# Patient Record
Sex: Female | Born: 1970 | Race: White | Hispanic: No | Marital: Single | Smoking: Never smoker
Health system: Southern US, Community
[De-identification: ages and names within clinical notes are randomized; demographics above are authoritative.]

## PROBLEM LIST (undated history)

## (undated) DIAGNOSIS — K219 Gastro-esophageal reflux disease without esophagitis: Secondary | ICD-10-CM

## (undated) DIAGNOSIS — R519 Headache, unspecified: Secondary | ICD-10-CM

## (undated) DIAGNOSIS — B369 Superficial mycosis, unspecified: Secondary | ICD-10-CM

## (undated) DIAGNOSIS — M1990 Unspecified osteoarthritis, unspecified site: Secondary | ICD-10-CM

## (undated) DIAGNOSIS — K209 Esophagitis, unspecified without bleeding: Secondary | ICD-10-CM

## (undated) DIAGNOSIS — R011 Cardiac murmur, unspecified: Secondary | ICD-10-CM

## (undated) DIAGNOSIS — G4730 Sleep apnea, unspecified: Secondary | ICD-10-CM

## (undated) DIAGNOSIS — D649 Anemia, unspecified: Secondary | ICD-10-CM

## (undated) DIAGNOSIS — E669 Obesity, unspecified: Secondary | ICD-10-CM

## (undated) DIAGNOSIS — I10 Essential (primary) hypertension: Secondary | ICD-10-CM

## (undated) HISTORY — DX: Superficial mycosis, unspecified: B36.9

## (undated) HISTORY — PX: HERNIA REPAIR: SHX51

## (undated) HISTORY — DX: Gastro-esophageal reflux disease without esophagitis: K21.9

## (undated) HISTORY — PX: TUBAL LIGATION: SHX77

## (undated) HISTORY — DX: Unspecified osteoarthritis, unspecified site: M19.90

## (undated) HISTORY — DX: Obesity, unspecified: E66.9

---

## 2000-08-20 ENCOUNTER — Other Ambulatory Visit: Admission: RE | Admit: 2000-08-20 | Discharge: 2000-08-20 | Payer: Self-pay | Admitting: Family Medicine

## 2009-02-03 ENCOUNTER — Other Ambulatory Visit: Admission: RE | Admit: 2009-02-03 | Discharge: 2009-02-03 | Payer: Self-pay | Admitting: Obstetrics and Gynecology

## 2010-12-10 ENCOUNTER — Encounter: Payer: Self-pay | Admitting: Obstetrics & Gynecology

## 2012-09-23 ENCOUNTER — Other Ambulatory Visit: Payer: Self-pay | Admitting: Obstetrics and Gynecology

## 2012-09-23 DIAGNOSIS — Z139 Encounter for screening, unspecified: Secondary | ICD-10-CM

## 2012-12-02 ENCOUNTER — Other Ambulatory Visit (HOSPITAL_COMMUNITY)
Admission: RE | Admit: 2012-12-02 | Discharge: 2012-12-02 | Disposition: A | Discharge status: Home / Self Care | Payer: BC Managed Care – PPO | Source: Ambulatory Visit | Attending: Obstetrics and Gynecology | Admitting: Obstetrics and Gynecology

## 2012-12-02 ENCOUNTER — Ambulatory Visit (HOSPITAL_COMMUNITY)
Admission: RE | Admit: 2012-12-02 | Discharge: 2012-12-02 | Disposition: A | Discharge status: Home / Self Care | Payer: BC Managed Care – PPO | Source: Ambulatory Visit | Attending: Obstetrics and Gynecology | Admitting: Obstetrics and Gynecology

## 2012-12-02 ENCOUNTER — Other Ambulatory Visit: Payer: Self-pay | Admitting: Adult Health

## 2012-12-02 DIAGNOSIS — Z1151 Encounter for screening for human papillomavirus (HPV): Secondary | ICD-10-CM | POA: Insufficient documentation

## 2012-12-02 DIAGNOSIS — Z01419 Encounter for gynecological examination (general) (routine) without abnormal findings: Secondary | ICD-10-CM | POA: Insufficient documentation

## 2012-12-02 DIAGNOSIS — Z139 Encounter for screening, unspecified: Secondary | ICD-10-CM

## 2012-12-02 DIAGNOSIS — Z1231 Encounter for screening mammogram for malignant neoplasm of breast: Secondary | ICD-10-CM | POA: Insufficient documentation

## 2012-12-05 ENCOUNTER — Other Ambulatory Visit: Payer: Self-pay | Admitting: Obstetrics and Gynecology

## 2012-12-05 DIAGNOSIS — R928 Other abnormal and inconclusive findings on diagnostic imaging of breast: Secondary | ICD-10-CM

## 2012-12-06 ENCOUNTER — Encounter: Payer: Self-pay | Admitting: Obstetrics and Gynecology

## 2012-12-24 ENCOUNTER — Other Ambulatory Visit: Payer: Self-pay | Admitting: Obstetrics and Gynecology

## 2012-12-24 ENCOUNTER — Ambulatory Visit (HOSPITAL_COMMUNITY)
Admission: RE | Admit: 2012-12-24 | Discharge: 2012-12-24 | Disposition: A | Discharge status: Home / Self Care | Payer: BC Managed Care – PPO | Source: Ambulatory Visit | Attending: Family Medicine | Admitting: Family Medicine

## 2012-12-24 ENCOUNTER — Ambulatory Visit (HOSPITAL_COMMUNITY)
Admission: RE | Admit: 2012-12-24 | Discharge: 2012-12-24 | Disposition: A | Discharge status: Home / Self Care | Payer: BC Managed Care – PPO | Source: Ambulatory Visit | Attending: Obstetrics and Gynecology | Admitting: Obstetrics and Gynecology

## 2012-12-24 ENCOUNTER — Other Ambulatory Visit: Payer: Self-pay | Admitting: Family Medicine

## 2012-12-24 ENCOUNTER — Ambulatory Visit
Admission: RE | Admit: 2012-12-24 | Discharge: 2012-12-24 | Disposition: A | Discharge status: Home / Self Care | Payer: BC Managed Care – PPO | Source: Ambulatory Visit | Attending: Obstetrics and Gynecology | Admitting: Obstetrics and Gynecology

## 2012-12-24 ENCOUNTER — Other Ambulatory Visit (HOSPITAL_COMMUNITY): Payer: Self-pay | Admitting: Obstetrics and Gynecology

## 2012-12-24 DIAGNOSIS — N63 Unspecified lump in unspecified breast: Secondary | ICD-10-CM

## 2012-12-24 DIAGNOSIS — R928 Other abnormal and inconclusive findings on diagnostic imaging of breast: Secondary | ICD-10-CM

## 2012-12-24 DIAGNOSIS — N6320 Unspecified lump in the left breast, unspecified quadrant: Secondary | ICD-10-CM

## 2012-12-24 MED ORDER — LIDOCAINE HCL (PF) 2 % IJ SOLN
INTRAMUSCULAR | Status: AC
  Administered 2012-12-24: 9 mL via INTRADERMAL
  Filled 2012-12-24: qty 10

## 2012-12-24 NOTE — Progress Notes (Signed)
Lidocaine 2%             9mL injected                         Left breast biopsy performed 

## 2013-02-20 ENCOUNTER — Encounter: Payer: Self-pay | Admitting: Family Medicine

## 2013-02-20 ENCOUNTER — Other Ambulatory Visit: Payer: BC Managed Care – PPO

## 2013-02-20 ENCOUNTER — Ambulatory Visit (INDEPENDENT_AMBULATORY_CARE_PROVIDER_SITE_OTHER): Payer: BC Managed Care – PPO | Admitting: Family Medicine

## 2013-02-20 VITALS — Ht 64.0 in | Wt 224.6 lb

## 2013-02-20 DIAGNOSIS — R7989 Other specified abnormal findings of blood chemistry: Secondary | ICD-10-CM

## 2013-02-20 DIAGNOSIS — IMO0001 Reserved for inherently not codable concepts without codable children: Secondary | ICD-10-CM

## 2013-02-20 DIAGNOSIS — E669 Obesity, unspecified: Secondary | ICD-10-CM

## 2013-02-20 DIAGNOSIS — M791 Myalgia, unspecified site: Secondary | ICD-10-CM | POA: Insufficient documentation

## 2013-02-20 DIAGNOSIS — K219 Gastro-esophageal reflux disease without esophagitis: Secondary | ICD-10-CM | POA: Insufficient documentation

## 2013-02-20 LAB — POCT CBC
Granulocyte percent: 78.6 %G (ref 37–80)
HCT, POC: 38.2 % (ref 37.7–47.9)
Hemoglobin: 12.9 g/dL (ref 12.2–16.2)
Lymph, poc: 1.8 (ref 0.6–3.4)
MCH, POC: 28.4 pg (ref 27–31.2)
MCHC: 33.8 g/dL (ref 31.8–35.4)
MCV: 83.8 fL (ref 80–97)
MPV: 7.9 fL (ref 0–99.8)
POC Granulocyte: 8.1 — AB (ref 2–6.9)
POC LYMPH PERCENT: 17.9 %L (ref 10–50)
Platelet Count, POC: 404 10*3/uL (ref 142–424)
RBC: 4.6 M/uL (ref 4.04–5.48)
RDW, POC: 12.8 %
WBC: 10.3 10*3/uL — AB (ref 4.6–10.2)

## 2013-02-20 LAB — SEDIMENTATION RATE: Sed Rate: 24 mm/hr — ABNORMAL HIGH (ref 0–22)

## 2013-02-20 LAB — C-REACTIVE PROTEIN: CRP: 1.4 mg/dL — ABNORMAL HIGH (ref <0.60)

## 2013-02-20 NOTE — Assessment & Plan Note (Signed)
meds refilled. Stable.

## 2013-02-20 NOTE — Patient Instructions (Addendum)
Diet and Exercise discussed with patient. For nutrition information, I recommend books: Eat to Live by Dr Joel Fuhrman. Prevent and Reverse Heart Disease by Dr Caldwell Esselstyn.  Exercise recommendations are:  If unable to walk, then the patient can exercise in a chair 3 times a day. By flapping arms like a bird gently and raising legs outwards to the front.  If ambulatory, the patient can go for walks for 30 minutes 3 times a week. Then increase the intensity and duration as tolerated. Goal is to try to attain exercise frequency to 5 times a week. Best to perform resistance exercises 2 days a week and cardio type exercises 3 days per week.  

## 2013-02-20 NOTE — Assessment & Plan Note (Signed)
supect that patient health is affected significantly by this problem

## 2013-02-20 NOTE — Assessment & Plan Note (Signed)
Probably related to obesity and  Sleep deprivation. Patient works 2 jobs, eats [employer] and has a obesity problem.

## 2013-02-20 NOTE — Progress Notes (Signed)
Patient ID: Deanna Long, female   DOB: 02/04/1971, 42 y.o.   MRN: [id number] SUBJECTIVE:   HPI: Here for follow up off abnormal blood work. She had her gynecologis examination. She was having myalgias. Labs were done. Results reflective of mild inflammation. Had labs redrawn. On review, her CRP was mildly elevated. Her platelets was mildly elevated and her sedimentation rate was high normal.   PMH/PSH: reviewed/updated in Epic  SH/FH: reviewed/updated in Epic  Allergies: reviewed/updated in Epic  Medications: reviewed/updated in Epic  Immunizations: reviewed/updated in Epic    ROS: No other complaints  OBJECTIVE:    On examination she appeared in good health and spirits. Very obese.  Vital signs as documented. Ht 5\' 4"  (1.626 m)  Wt 224 lb 9.6 oz (101.878 kg)  BMI 38.53 kg/m2  LMP 01/20/2013  Skin warm and dry and without overt rashes.  Head, Eyes, Ears, throat: normal Neck without JVD.  Lungs clear.  Heart exam notable for regular rhythm, normal sounds and absence of murmurs, rubs or gallops. Abdomen unremarkable and without evidence of organomegaly, masses, or abdominal aortic enlargement.  GYN Exam:deferred External Genitalia:N/A Vagina: Cervix: Uterus: Adnexa: R/V: Extremities nonedematous. No trigger points for Fibromyalgias Neurologic: nonfocal  ASSESSMENT:  Obesity, unspecified supect that patient health is affected significantly by this problem  GERD (gastroesophageal reflux disease) meds refilled. Stable.  Muscle ache Probably related to obesity and  Sleep deprivation. Patient works 2 jobs, eats [employer] and has a obesity problem.     PLAN: Orders Placed This Encounter  Procedures  . Sedimentation rate  . C-reactive protein  . POCT CBC   Results for orders placed in visit on 02/20/13 (from the past 24 hour(s))  POCT CBC     Status: Abnormal   Collection Time    02/20/13 10:40 AM      Result Value Range   WBC 10.3 (*) 4.6 - 10.2 K/uL   Lymph, poc 1.8  0.6 - 3.4   POC LYMPH PERCENT 17.9  10 - 50 %L   POC Granulocyte 8.1 (*) 2 - 6.9   Granulocyte percent 78.6  37 - 80 %G   RBC 4.6  4.04 - 5.48 M/uL   Hemoglobin 12.9  12.2 - 16.2 g/dL   HCT, POC 82.9  56.2 - 47.9 %   MCV 83.8  80 - 97 fL   MCH, POC 28.4  27 - 31.2 pg   MCHC 33.8  31.8 - 35.4 g/dL   RDW, POC 13.0     Platelet Count, POC 404.0  142 - 424 K/uL   MPV 7.9  0 - 99.8 fL                               Meds ordered this encounter  Medications  . ibuprofen (ADVIL,MOTRIN) 800 MG tablet    Sig: Take 800 mg by mouth every 8 (eight) hours as needed for pain.  Marland Kitchen esomeprazole (NEXIUM) 40 MG capsule    Sig: Take 40 mg by mouth daily before breakfast.  Diet and Exercise discussed with patient. For nutrition information, I recommend books: Eat to Live by Dr Monico Hoar. Prevent and Reverse Heart Disease by Dr Suzzette Righter.  Exercise recommendations are:  If unable to walk, then the patient can exercise in a chair 3 times a day. By flapping arms like a bird gently and raising legs outwards to the front.  If ambulatory, the patient can go  for walks for 30 minutes 3 times a week. Then increase the intensity and duration as tolerated. Goal is to try to attain exercise frequency to 5 times a week. Best to perform resistance exercises 2 days a week and cardio type exercises 3 days per week. Balanced Lifestyle. Await labs.  Kadrian Partch P. Modesto Charon, M.D.

## 2013-02-22 NOTE — Progress Notes (Signed)
Quick Note:  Labs abnormal.Still borderline abnormal. Recommend diet weight loss and consider Rheumatologist for consultation? If patient wants. ______

## 2013-02-24 ENCOUNTER — Other Ambulatory Visit: Payer: Self-pay | Admitting: Family Medicine

## 2013-02-24 DIAGNOSIS — M25559 Pain in unspecified hip: Secondary | ICD-10-CM

## 2013-08-19 ENCOUNTER — Ambulatory Visit (INDEPENDENT_AMBULATORY_CARE_PROVIDER_SITE_OTHER): Payer: BC Managed Care – PPO | Admitting: Family Medicine

## 2013-08-19 VITALS — BP 146/90 | HR 70 | Temp 97.0°F | Ht 62.0 in | Wt 220.0 lb

## 2013-08-19 DIAGNOSIS — R634 Abnormal weight loss: Secondary | ICD-10-CM

## 2013-08-19 MED ORDER — PHENTERMINE HCL 30 MG PO CAPS: 30.0000 mg | ORAL_CAPSULE | ORAL | Status: DC

## 2013-08-19 NOTE — Progress Notes (Signed)
  Subjective:    Patient ID: Deanna Long, female    DOB: 08-05-1971, 43 y.o.   MRN: [id number]  HPI This 42 y.o. female presents for evaluation of needing to get into a weight loss program For her work. She is being offered up to 60% off her insurance plan if she gets into a  Weight loss program.  She has a form for work she needs filled out.   Review of Systems No chest pain, SOB, HA, dizziness, vision change, N/V, diarrhea, constipation, dysuria, urinary urgency or frequency, myalgias, arthralgias or rash.     Objective:   Physical Exam Vital signs noted  Well developed well nourished female.  HEENT - Head atraumatic Normocephalic                Eyes - PERRLA, Conjuctiva - clear Sclera- Clear EOMI                Ears - EAC's Wnl TM's Wnl Gross Hearing WNL                Nose - Nares patent                 Throat - oropharanx wnl Respiratory - Lungs CTA bilateral Cardiac - RRR S1 and S2 without murmur GI - Abdomen soft Nontender and bowel sounds active x 4.      Assessment & Plan:  Loss of weight - Plan: phentermine 30 MG capsule Recommend she follow up with Deanna Long Pharm D Follow up prn. Form from work filled out.  Deanna Canter FNP

## 2013-08-24 ENCOUNTER — Other Ambulatory Visit: Payer: Self-pay

## 2013-08-24 NOTE — Telephone Encounter (Signed)
Last seen 02/20/13  FPW

## 2013-08-24 NOTE — Telephone Encounter (Signed)
Patient needs to be seen. Has exceeded time since last visit. Needs to bring all medications to next appointment. She was referred to the rheumatologist and supposedly treated.

## 2013-09-24 ENCOUNTER — Other Ambulatory Visit: Payer: Self-pay

## 2014-01-19 ENCOUNTER — Other Ambulatory Visit: Payer: Self-pay | Admitting: Obstetrics and Gynecology

## 2014-01-19 DIAGNOSIS — Z1231 Encounter for screening mammogram for malignant neoplasm of breast: Secondary | ICD-10-CM

## 2014-02-04 ENCOUNTER — Ambulatory Visit (HOSPITAL_COMMUNITY): Payer: BC Managed Care – PPO

## 2014-02-04 ENCOUNTER — Ambulatory Visit: Payer: Managed Care, Other (non HMO) | Admitting: Family Medicine

## 2014-02-08 ENCOUNTER — Telehealth: Payer: Self-pay | Admitting: Family Medicine

## 2014-02-08 NOTE — Telephone Encounter (Signed)
Appt given for patient request

## 2014-02-11 ENCOUNTER — Ambulatory Visit (INDEPENDENT_AMBULATORY_CARE_PROVIDER_SITE_OTHER): Payer: Managed Care, Other (non HMO) | Admitting: Adult Health

## 2014-02-11 ENCOUNTER — Ambulatory Visit (HOSPITAL_COMMUNITY)
Admission: RE | Admit: 2014-02-11 | Discharge: 2014-02-11 | Disposition: A | Discharge status: Home / Self Care | Payer: Managed Care, Other (non HMO) | Source: Ambulatory Visit | Attending: Obstetrics and Gynecology | Admitting: Obstetrics and Gynecology

## 2014-02-11 ENCOUNTER — Ambulatory Visit (INDEPENDENT_AMBULATORY_CARE_PROVIDER_SITE_OTHER): Payer: Managed Care, Other (non HMO) | Admitting: Family Medicine

## 2014-02-11 ENCOUNTER — Encounter: Payer: Self-pay | Admitting: Family Medicine

## 2014-02-11 ENCOUNTER — Encounter: Payer: Self-pay | Admitting: Adult Health

## 2014-02-11 ENCOUNTER — Ambulatory Visit: Payer: BC Managed Care – PPO | Admitting: Family Medicine

## 2014-02-11 ENCOUNTER — Telehealth: Payer: Self-pay | Admitting: Family Medicine

## 2014-02-11 VITALS — BP 125/80 | HR 72 | Temp 98.3°F | Ht 62.0 in | Wt 215.2 lb

## 2014-02-11 VITALS — BP 118/70 | HR 74 | Ht 62.0 in | Wt 216.0 lb

## 2014-02-11 DIAGNOSIS — Z1231 Encounter for screening mammogram for malignant neoplasm of breast: Secondary | ICD-10-CM

## 2014-02-11 DIAGNOSIS — M549 Dorsalgia, unspecified: Secondary | ICD-10-CM

## 2014-02-11 DIAGNOSIS — Z1212 Encounter for screening for malignant neoplasm of rectum: Secondary | ICD-10-CM

## 2014-02-11 DIAGNOSIS — Z01419 Encounter for gynecological examination (general) (routine) without abnormal findings: Secondary | ICD-10-CM

## 2014-02-11 LAB — HEMOCCULT GUIAC POC 1CARD (OFFICE): Fecal Occult Blood, POC: NEGATIVE

## 2014-02-11 MED ORDER — METHYLPREDNISOLONE (PAK) 4 MG PO TABS: ORAL_TABLET | ORAL | Status: DC

## 2014-02-11 NOTE — Progress Notes (Signed)
   Subjective:    Patient ID: Deanna HaywardGina Long, female    DOB: 09/11/1971, 43 y.o.   MRN: [id number]  HPI This 43 y.o. female presents for evaluation of back pain and pain in her legs and her feet. She has back pain for a week and she notices it gets worse over last few days.  Review of Systems C/o back pain   No chest pain, SOB, HA, dizziness, vision change, N/V, diarrhea, constipation, dysuria, urinary urgency or frequency or rash.  Objective:   Physical Exam Vital signs noted  Well developed well nourished female.  HEENT - Head atraumatic Normocephalic                Eyes - PERRLA, Conjuctiva - clear Sclera- Clear EOMI                Ears - EAC's Wnl TM's Wnl Gross Hearing WNL                Nose - Nares patent                 Throat - oropharanx wnl Respiratory - Lungs CTA bilateral Cardiac - RRR S1 and S2 without murmur GI - Abdomen soft Nontender and bowel sounds active x 4 MS - TTP lumbar spine and decreased ROM LS spine      Assessment & Plan:  Back pain - Plan: DG Lumbar Spine 2-3 Views, methylPREDNIsolone (MEDROL DOSPACK) 4 MG tablet Continue motrin 800mg  one po tid   Prilosec otc prn for GERD  Follow up in one week or if sxs worsen  Deatra CanterWilliam J Lilit Cinelli FNP

## 2014-02-11 NOTE — Patient Instructions (Signed)

## 2014-02-11 NOTE — Telephone Encounter (Signed)
appt rsch

## 2014-02-11 NOTE — Patient Instructions (Signed)
Physical in 1 year Mammogram yearly  

## 2014-02-11 NOTE — Progress Notes (Signed)
Patient ID: Deanna Long, female   DOB: 08/28/1971, 43 y.o.   MRN: [id number] History of Present Illness: Deanna Long is a 43 year old white female in for physical.She has a normal pap with negative HPV 12/02/12.   Current Medications, Allergies, Past Medical History, Past Surgical History, Family History and Social History were reviewed in [facility] electronic medical record.     Review of Systems: Patient denies any headaches, blurred vision, shortness of breath, chest pain, abdominal pain, problems with bowel movements, urination, or intercourse.  No joint pain or mood swings, had mammogram today, and F/U with PCP this pm.   Physical Exam:BP 118/70  Pulse 74  Ht 5\' 2"  (1.575 m)  Wt 216 lb (97.977 kg)  BMI 39.50 kg/m2  LMP 01/28/2014 General:  Well developed, well nourished, no acute distress Skin:  Warm and dry Neck:  Midline trachea, normal thyroid Lungs; Clear to auscultation bilaterally Breast:  No dominant palpable mass, retraction, or nipple discharge Cardiovascular: Regular rate and rhythm Abdomen:  Soft, non tender, no hepatosplenomegaly Pelvic:  External genitalia is normal in appearance.  The vagina is normal in appearance. The cervix is bulbous.  Uterus is felt to be normal size, shape, and contour.  No   adnexal masses or tenderness noted. Rectal: Good sphincter tone, no polyps, or hemorrhoids felt.  Hemoccult negative. Extremities:  No swelling or varicosities noted Psych:  No mood changes, alert and cooperative, seems happy   Impression: Yearly gyn exam no pap    Plan: Physical in 1 year Mammogram yearly Call prn

## 2014-02-12 ENCOUNTER — Other Ambulatory Visit (INDEPENDENT_AMBULATORY_CARE_PROVIDER_SITE_OTHER): Payer: Managed Care, Other (non HMO)

## 2014-02-12 DIAGNOSIS — M549 Dorsalgia, unspecified: Secondary | ICD-10-CM

## 2014-02-18 ENCOUNTER — Encounter: Payer: Self-pay | Admitting: Family Medicine

## 2014-02-18 ENCOUNTER — Ambulatory Visit (INDEPENDENT_AMBULATORY_CARE_PROVIDER_SITE_OTHER): Payer: Managed Care, Other (non HMO) | Admitting: Family Medicine

## 2014-02-18 ENCOUNTER — Other Ambulatory Visit: Payer: Self-pay | Admitting: *Deleted

## 2014-02-18 VITALS — BP 127/79 | HR 64 | Temp 97.5°F | Ht 62.0 in | Wt 219.2 lb

## 2014-02-18 DIAGNOSIS — IMO0002 Reserved for concepts with insufficient information to code with codable children: Secondary | ICD-10-CM

## 2014-02-18 DIAGNOSIS — M5416 Radiculopathy, lumbar region: Secondary | ICD-10-CM

## 2014-02-18 MED ORDER — ALPRAZOLAM 0.25 MG PO TABS: 0.2500 mg | ORAL_TABLET | Freq: Every evening | ORAL | Status: DC | PRN

## 2014-02-18 MED ORDER — PREDNISONE 10 MG PO TABS: ORAL_TABLET | ORAL | Status: DC

## 2014-02-18 MED ORDER — IBUPROFEN 800 MG PO TABS: 800.0000 mg | ORAL_TABLET | Freq: Three times a day (TID) | ORAL | Status: DC | PRN

## 2014-02-22 NOTE — Progress Notes (Signed)
   Subjective:    Patient ID: Deanna Long, female    DOB: 10/07/1971, 43 y.o.   MRN: [id number]  HPI  This 43 y.o. female presents for evaluation of continued back and right leg numbness and weakness. She took a medrol dose pack and this didn't help.  She had a normal Xray of her LS spine except for some bone spurring.  She states her pain radiates down her back to her foot and she is having weakness in this leg.  Review of Systems C/o right lower extremity pain.   No chest pain, SOB, HA, dizziness, vision change, N/V, diarrhea, constipation, dysuria, urinary urgency or frequency, myalgias, arthralgias or rash.  Objective:   Physical Exam  Vital signs noted  Well developed well nourished female.  HEENT - Head atraumatic Normocephalic                Eyes - PERRLA, Conjuctiva - clear Sclera- Clear EOMI                Ears - EAC's Wnl TM's Wnl Gross Hearing WNL                Throat - oropharanx wnl Respiratory - Lungs CTA bilateral Cardiac - RRR S1 and S2 without murmur GI - Abdomen soft Nontender and bowel sounds active x 4 Extremities - weakness in right lower extremity. Neuro - Weakness in right foot and lower extremity MS - TTP right LS muscle and positive right SLR.     Assessment & Plan:  Lumbar back pain with radiculopathy affecting right lower extremity - Plan: MR Lumbar Spine Wo Contrast Prednisone taper 10mg  4x2,3x2,2x2,1x2 then stop  Deanna CanterWilliam J Oxford FNP Deanna CanterWilliam J Oxford FNP

## 2014-03-09 ENCOUNTER — Telehealth: Payer: Self-pay | Admitting: Family Medicine

## 2014-03-10 NOTE — Telephone Encounter (Signed)
appt given for thurs 

## 2014-03-11 ENCOUNTER — Ambulatory Visit (INDEPENDENT_AMBULATORY_CARE_PROVIDER_SITE_OTHER): Payer: Managed Care, Other (non HMO) | Admitting: Family Medicine

## 2014-03-11 ENCOUNTER — Encounter: Payer: Self-pay | Admitting: Family Medicine

## 2014-03-11 VITALS — BP 165/91 | HR 80 | Temp 96.5°F | Ht 62.0 in | Wt 217.0 lb

## 2014-03-11 DIAGNOSIS — M549 Dorsalgia, unspecified: Secondary | ICD-10-CM

## 2014-03-11 DIAGNOSIS — G4700 Insomnia, unspecified: Secondary | ICD-10-CM

## 2014-03-11 MED ORDER — HYDROCODONE-ACETAMINOPHEN 5-325 MG PO TABS: 1.0000 | ORAL_TABLET | Freq: Four times a day (QID) | ORAL | Status: DC | PRN

## 2014-03-11 MED ORDER — ALPRAZOLAM 0.5 MG PO TABS: 0.5000 mg | ORAL_TABLET | Freq: Every evening | ORAL | Status: DC | PRN

## 2014-03-11 NOTE — Progress Notes (Signed)
   Subjective:    Patient ID: Deanna HaywardGina Long, female    DOB: 04/28/1971, 43 y.o.   MRN: [id number]  HPI This 43 y.o. female presents for evaluation of back pain and some radiation down her right leg and it is better but she is still hurting.   She is having insomnia and has been taking 2 xanax 0.25mg  and she is running out and would like a refill.  She is scheduled for MRI of LS spine.  She would like a note to be out of work for a few days.   Review of Systems C/o back pain No chest pain, SOB, HA, dizziness, vision change, N/V, diarrhea, constipation, dysuria, urinary urgency or frequency, myalgias, arthralgias or rash.     Objective:   Physical Exam Vital signs noted  Well developed well nourished female.  HEENT - Head atraumatic Normocephalic                Eyes - PERRLA, Conjuctiva - clear Sclera- Clear EOMI                Ears - EAC's Wnl TM's Wnl Gross Hearing WNL                Throat - oropharanx wnl Respiratory - Lungs CTA bilateral Cardiac - RRR S1 and S2 without murmur GI - Abdomen soft Nontender and bowel sounds active x 4 MS - TTP lumbar paraspinous muscles with decreased ROM LS spine.  Neg SLR bilateral       Assessment & Plan:  Back pain - Plan: HYDROcodone-acetaminophen (NORCO) 5-325 MG per tablet  Insomnia - Plan: ALPRAZolam (XANAX) 0.5 MG tablet  Await MRI   Follow up prn and note for work  Deatra CanterWilliam J Denissa Cozart FNP

## 2014-03-26 ENCOUNTER — Other Ambulatory Visit: Payer: Self-pay | Admitting: Family Medicine

## 2014-03-30 ENCOUNTER — Other Ambulatory Visit: Payer: Self-pay

## 2014-03-30 DIAGNOSIS — R634 Abnormal weight loss: Secondary | ICD-10-CM

## 2014-03-30 NOTE — Telephone Encounter (Signed)
Last seen 03/11/14 B Oxford  If approved [facility]  [phone]

## 2014-03-31 MED ORDER — PHENTERMINE HCL 30 MG PO CAPS: 30.0000 mg | ORAL_CAPSULE | ORAL | Status: DC

## 2014-04-21 ENCOUNTER — Other Ambulatory Visit: Payer: Self-pay | Admitting: Family Medicine

## 2014-04-22 NOTE — Telephone Encounter (Signed)
Last seen 03/11/14  B Oxford

## 2014-06-09 ENCOUNTER — Other Ambulatory Visit: Payer: Self-pay | Admitting: Family Medicine

## 2014-09-20 ENCOUNTER — Encounter: Payer: Self-pay | Admitting: Family Medicine

## 2014-11-10 ENCOUNTER — Other Ambulatory Visit: Payer: Self-pay | Admitting: Family Medicine

## 2014-11-10 NOTE — Telephone Encounter (Signed)
deined - ntbs

## 2014-11-10 NOTE — Telephone Encounter (Signed)
Last seen 03/11/14 B Oxford  If approved [facility] [phone]

## 2015-02-18 ENCOUNTER — Encounter: Payer: Managed Care, Other (non HMO) | Admitting: Physician Assistant

## 2015-03-09 NOTE — Progress Notes (Signed)
   Subjective:    Patient ID: Deanna Long, female    DOB: 03/01/1971, 44 y.o.   MRN: [id number]  HPI A user error has taken place: encounter opened in error, closed for administrative reasons.    Review of Systems     Objective:   Physical Exam        Assessment & Plan:

## 2015-08-09 ENCOUNTER — Other Ambulatory Visit: Payer: Self-pay | Admitting: Obstetrics and Gynecology

## 2015-08-09 DIAGNOSIS — Z1231 Encounter for screening mammogram for malignant neoplasm of breast: Secondary | ICD-10-CM

## 2015-08-15 ENCOUNTER — Ambulatory Visit (HOSPITAL_COMMUNITY)
Admission: RE | Admit: 2015-08-15 | Discharge: 2015-08-15 | Disposition: A | Discharge status: Home / Self Care | Payer: BLUE CROSS/BLUE SHIELD | Source: Ambulatory Visit | Attending: Obstetrics and Gynecology | Admitting: Obstetrics and Gynecology

## 2015-08-15 DIAGNOSIS — Z1231 Encounter for screening mammogram for malignant neoplasm of breast: Secondary | ICD-10-CM | POA: Diagnosis present

## 2015-08-23 ENCOUNTER — Encounter: Payer: Self-pay | Admitting: Adult Health

## 2015-08-23 ENCOUNTER — Ambulatory Visit (INDEPENDENT_AMBULATORY_CARE_PROVIDER_SITE_OTHER): Payer: BLUE CROSS/BLUE SHIELD | Admitting: Adult Health

## 2015-08-23 VITALS — BP 140/80 | HR 72 | Ht 62.0 in | Wt 228.5 lb

## 2015-08-23 DIAGNOSIS — Z6841 Body Mass Index (BMI) 40.0 and over, adult: Secondary | ICD-10-CM

## 2015-08-23 DIAGNOSIS — Z01419 Encounter for gynecological examination (general) (routine) without abnormal findings: Secondary | ICD-10-CM

## 2015-08-23 DIAGNOSIS — B369 Superficial mycosis, unspecified: Secondary | ICD-10-CM

## 2015-08-23 DIAGNOSIS — Z1212 Encounter for screening for malignant neoplasm of rectum: Secondary | ICD-10-CM

## 2015-08-23 HISTORY — DX: Superficial mycosis, unspecified: B36.9

## 2015-08-23 LAB — HEMOCCULT GUIAC POC 1CARD (OFFICE): FECAL OCCULT BLD: NEGATIVE

## 2015-08-23 MED ORDER — NYSTATIN-TRIAMCINOLONE 100000-0.1 UNIT/GM-% EX CREA: 1.0000 "application " | TOPICAL_CREAM | Freq: Two times a day (BID) | CUTANEOUS | Status: DC

## 2015-08-23 NOTE — Progress Notes (Signed)
Patient ID: Deanna Long, female   DOB: 07-25-1971, 44 y.o.   MRN: [id number] History of Present Illness:  Deanna Long is a 44 year old white female, in for well woman gyn exam, had normal pap with negative HPV 12/02/12.She has female partner.She says she went to bariatric seminar and is thinking of getting sleeve surgery.She is tired at times and sometimes is short of breath. PCP is [place].  Current Medications, Allergies, Past Medical History, Past Surgical History, Family History and Social History were reviewed in [employer] [facility] record.     Review of Systems: Patient denies any headaches, hearing loss,  blurred vision,  chest pain, abdominal pain, problems with bowel movements, urination, or intercourse(not having sex). No joint pain(knees ache) or mood swings.See HPI for positives.    Physical Exam:BP 140/80 mmHg  Pulse 72  Ht  (1.575 m)  Wt 228 lb 8 oz (103.647 kg)  BMI 41.78 kg/m2  LMP 08/08/2015 General:  Well developed, well nourished, no acute distress Skin:  Warm and dry Neck:  Midline trachea, normal thyroid, good ROM, no lymphadenopathy Lungs; Clear to auscultation bilaterally Breast:  No dominant palpable mass, retraction, or nipple discharge Cardiovascular: Regular rate and rhythm Abdomen:  Soft, non tender, no hepatosplenomegaly Pelvic:  External genitalia is normal in appearance, no lesions. Has redness in right groin like yeast. The vagina is normal in appearance. Urethra has no lesions or masses. The cervix is bulbous.  Uterus is felt to be normal size, shape, and contour.  No adnexal masses or tenderness noted.Bladder is non tender, no masses felt. Rectal: Good sphincter tone, no polyps, or hemorrhoids felt.  Hemoccult negative. Extremities/musculoskeletal:  No swelling or varicosities noted, no clubbing or cyanosis Psych:  No mood changes, alert and cooperative,seems happy   Impression: Well woman gyn exam no pap BMI 41.78 Skin fungus      Plan: Check CBC,CMP,TSH and lipids Rx mytrex cream use 2-3 x daily prn with 1 refill Pap and physical in 1 year Mammogram yearly

## 2015-08-23 NOTE — Patient Instructions (Signed)
Pap and physical in  1 year Mammogram yearly Will talk after labs back

## 2015-08-24 ENCOUNTER — Telehealth: Payer: Self-pay | Admitting: Adult Health

## 2015-08-24 LAB — COMPREHENSIVE METABOLIC PANEL
ALT: 32 IU/L (ref 0–32)
AST: 29 IU/L (ref 0–40)
Albumin/Globulin Ratio: 1.8 (ref 1.1–2.5)
Albumin: 4.8 g/dL (ref 3.5–5.5)
Alkaline Phosphatase: 138 IU/L — ABNORMAL HIGH (ref 39–117)
BUN/Creatinine Ratio: 17 (ref 9–23)
BUN: 12 mg/dL (ref 6–24)
Bilirubin Total: 0.2 mg/dL (ref 0.0–1.2)
CALCIUM: 10.4 mg/dL — AB (ref 8.7–10.2)
CHLORIDE: 105 mmol/L (ref 97–108)
CO2: 20 mmol/L (ref 18–29)
Creatinine, Ser: 0.7 mg/dL (ref 0.57–1.00)
GFR, EST AFRICAN AMERICAN: 120 mL/min/{1.73_m2} (ref >59)
GFR, EST NON AFRICAN AMERICAN: 104 mL/min/{1.73_m2} (ref >59)
GLUCOSE: 140 mg/dL — AB (ref 65–99)
Globulin, Total: 2.7 g/dL (ref 1.5–4.5)
Potassium: 4.4 mmol/L (ref 3.5–5.2)
Sodium: 143 mmol/L (ref 134–144)
TOTAL PROTEIN: 7.5 g/dL (ref 6.0–8.5)

## 2015-08-24 LAB — CBC
Hematocrit: 39.8 % (ref 34.0–46.6)
Hemoglobin: 13.4 g/dL (ref 11.1–15.9)
MCH: 29.1 pg (ref 26.6–33.0)
MCHC: 33.7 g/dL (ref 31.5–35.7)
MCV: 87 fL (ref 79–97)
PLATELETS: 446 10*3/uL — AB (ref 150–379)
RBC: 4.6 x10E6/uL (ref 3.77–5.28)
RDW: 12.9 % (ref 12.3–15.4)
WBC: 10.9 10*3/uL — AB (ref 3.4–10.8)

## 2015-08-24 LAB — LIPID PANEL
CHOL/HDL RATIO: 3.4 ratio (ref 0.0–4.4)
CHOLESTEROL TOTAL: 195 mg/dL (ref 100–199)
HDL: 58 mg/dL (ref >39)
LDL CALC: 116 mg/dL — AB (ref 0–99)
TRIGLYCERIDES: 103 mg/dL (ref 0–149)
VLDL Cholesterol Cal: 21 mg/dL (ref 5–40)

## 2015-08-24 LAB — TSH: TSH: 0.884 u[IU]/mL (ref 0.450–4.500)

## 2015-08-24 LAB — SPECIMEN STATUS REPORT

## 2015-08-24 NOTE — Telephone Encounter (Signed)
Pt aware of labs and that BS 140 add A1c, will call her with results,will be monday

## 2015-08-25 LAB — HGB A1C W/O EAG: Hgb A1c MFr Bld: 5.7 % — ABNORMAL HIGH (ref 4.8–5.6)

## 2015-08-25 LAB — SPECIMEN STATUS REPORT

## 2015-08-29 ENCOUNTER — Telehealth: Payer: Self-pay | Admitting: Adult Health

## 2015-08-29 NOTE — Telephone Encounter (Signed)
Left message A1c 5.7 is at risk for diabetes but does not have, decrease carbs and increase exercise and recheck in 1 year

## 2015-09-07 ENCOUNTER — Telehealth: Payer: Self-pay | Admitting: Adult Health

## 2015-09-07 MED ORDER — NYSTATIN 100000 UNIT/GM EX CREA: 1.0000 "application " | TOPICAL_CREAM | Freq: Two times a day (BID) | CUTANEOUS | Status: DC

## 2015-09-07 MED ORDER — TRIAMCINOLONE ACETONIDE 0.1 % EX OINT
1.0000 | TOPICAL_OINTMENT | Freq: Two times a day (BID) | CUTANEOUS | Status: DC
Start: 2015-09-07 — End: 2016-09-28

## 2015-09-07 NOTE — Telephone Encounter (Signed)
Pt states that the cream that was called needs to be called in as separate medications.

## 2015-09-07 NOTE — Telephone Encounter (Signed)
Will change rx from mytrex to nystatin and triamcinolone cream

## 2016-09-28 ENCOUNTER — Ambulatory Visit (INDEPENDENT_AMBULATORY_CARE_PROVIDER_SITE_OTHER): Payer: Managed Care, Other (non HMO) | Admitting: Family

## 2016-09-28 ENCOUNTER — Encounter: Payer: Self-pay | Admitting: Family

## 2016-09-28 VITALS — BP 182/114 | HR 75 | Temp 97.0°F | Ht 62.0 in | Wt 237.0 lb

## 2016-09-28 DIAGNOSIS — Z6841 Body Mass Index (BMI) 40.0 and over, adult: Secondary | ICD-10-CM

## 2016-09-28 DIAGNOSIS — R3589 Other polyuria: Secondary | ICD-10-CM

## 2016-09-28 DIAGNOSIS — R631 Polydipsia: Secondary | ICD-10-CM

## 2016-09-28 DIAGNOSIS — I10 Essential (primary) hypertension: Secondary | ICD-10-CM | POA: Diagnosis not present

## 2016-09-28 DIAGNOSIS — R7303 Prediabetes: Secondary | ICD-10-CM | POA: Diagnosis not present

## 2016-09-28 DIAGNOSIS — R5383 Other fatigue: Secondary | ICD-10-CM | POA: Diagnosis not present

## 2016-09-28 DIAGNOSIS — Z23 Encounter for immunization: Secondary | ICD-10-CM | POA: Diagnosis not present

## 2016-09-28 DIAGNOSIS — R358 Other polyuria: Secondary | ICD-10-CM

## 2016-09-28 DIAGNOSIS — R531 Weakness: Secondary | ICD-10-CM

## 2016-09-28 LAB — BAYER DCA HB A1C WAIVED: HB A1C: 4.8 % (ref <7.0)

## 2016-09-28 MED ORDER — LISINOPRIL-HYDROCHLOROTHIAZIDE 20-12.5 MG PO TABS: 1.0000 | ORAL_TABLET | Freq: Every day | ORAL | 3 refills | Status: DC

## 2016-09-28 NOTE — Progress Notes (Signed)
Subjective:    Patient ID: Deanna Long, female    DOB: June 07, 1971, 45 y.o.   MRN: [id number]  PT presents to the office today for weakness and fatigued that started two weeks ago. PT states it has been hard to drive. She has had to pull over to sleep. PT states she has been told in the past that she was a border line diabetic. PT states over the last two weeks she has had polydipsia and polyuria.  Hypertension  This is a new problem. The current episode started today. The problem is unchanged. The problem is uncontrolled. Associated symptoms include blurred vision, malaise/fatigue, peripheral edema and shortness of breath. Pertinent negatives include no anxiety or headaches. Risk factors for coronary artery disease include obesity, sedentary lifestyle and family history. Past treatments include nothing. The current treatment provides no improvement. There is no history of kidney disease, CAD/MI, CVA, heart failure or a thyroid problem. There is no history of sleep apnea.      Review of Systems  Constitutional: Positive for malaise/fatigue.  Eyes: Positive for blurred vision.  Respiratory: Positive for shortness of breath.   Endocrine: Positive for polydipsia and polyuria.  Neurological: Negative for headaches.  All other systems reviewed and are negative.      Objective:   Physical Exam  Constitutional: She is oriented to person, place, and time. She appears well-developed and well-nourished. No distress.  Morbid Obese   HENT:  Head: Normocephalic and atraumatic.  Right Ear: External ear normal.  Left Ear: External ear normal.  Nose: Nose normal.  Mouth/Throat: Oropharynx is clear and moist.  Eyes: Pupils are equal, round, and reactive to light.  Neck: Normal range of motion. Neck supple. No thyromegaly present.  Cardiovascular: Normal rate, regular rhythm, normal heart sounds and intact distal pulses.   No murmur heard. Pulmonary/Chest: Effort normal and breath sounds normal. No  respiratory distress. She has no wheezes.  Abdominal: Soft. Bowel sounds are normal. She exhibits no distension. There is no tenderness.  Musculoskeletal: Normal range of motion. She exhibits no edema or tenderness.  Neurological: She is alert and oriented to person, place, and time. She has normal reflexes. No cranial nerve deficit.  Skin: Skin is warm and dry.  Psychiatric: She has a normal mood and affect. Her behavior is normal. Judgment and thought content normal.  Vitals reviewed.     BP (!) 182/114   Pulse 75   Temp 97 F (36.1 C) (Oral)   Ht _0  (1.575 m)   Wt 237 lb (107.5 kg)   BMI 43.35 kg/m      Assessment & Plan:  1. Essential hypertension -PT started on Zestoretic  - CMP14+EGFR - lisinopril-hydrochlorothiazide (ZESTORETIC) 20-12.5 MG tablet; Take 1 tablet by mouth daily.  Dispense: 90 tablet; Refill: 3  2. Prediabetes - CMP14+EGFR - Bayer DCA Hb A1c Waived  3. Other fatigue - CMP14+EGFR - Thyroid Panel With TSH - Anemia Profile B - VITAMIN D 25 Hydroxy (Vit-D Deficiency, Fractures)  4. Weakness - CMP14+EGFR - Thyroid Panel With TSH - Anemia Profile B - VITAMIN D 25 Hydroxy (Vit-D Deficiency, Fractures)  5. Polydipsia - CMP14+EGFR - Bayer DCA Hb A1c Waived  6. Polyuria - CMP14+EGFR - Bayer DCA Hb A1c Waived  7. Morbid obesity with BMI of 40.0-44.9, adult (HCC) - CMP14+EGFR - Bayer DCA Hb A1c Waived   Continue all meds Labs pending Health Maintenance reviewed Diet and exercise encouraged RTO 2 weeks to recheck HTN  Evelina Dun, FNP

## 2016-09-28 NOTE — Patient Instructions (Signed)
Diabetes Mellitus and Food It is important for you to manage your blood sugar (glucose) level. Your blood glucose level can be greatly affected by what you eat. Eating healthier foods in the appropriate amounts throughout the day at about the same time each day will help you control your blood glucose level. It can also help slow or prevent worsening of your diabetes mellitus. Healthy eating may even help you improve the level of your blood pressure and reach or maintain a healthy weight.  General recommendations for healthful eating and cooking habits include:  Eating meals and snacks regularly. Avoid going long periods of time without eating to lose weight.  Eating a diet that consists mainly of plant-based foods, such as fruits, vegetables, nuts, legumes, and whole grains.  Using low-heat cooking methods, such as baking, instead of high-heat cooking methods, such as deep frying. Work with your dietitian to make sure you understand how to use the Nutrition Facts information on food labels. HOW CAN FOOD AFFECT ME? Carbohydrates Carbohydrates affect your blood glucose level more than any other type of food. Your dietitian will help you determine how many carbohydrates to eat at each meal and teach you how to count carbohydrates. Counting carbohydrates is important to keep your blood glucose at a healthy level, especially if you are using insulin or taking certain medicines for diabetes mellitus. Alcohol Alcohol can cause sudden decreases in blood glucose (hypoglycemia), especially if you use insulin or take certain medicines for diabetes mellitus. Hypoglycemia can be a life-threatening condition. Symptoms of hypoglycemia (sleepiness, dizziness, and disorientation) are similar to symptoms of having too much alcohol.  If your health care provider has given you approval to drink alcohol, do so in moderation and use the following guidelines:  Women should not have more than one drink per day, and men  should not have more than two drinks per day. One drink is equal to:  12 oz of beer.  5 oz of wine.  1 oz of hard liquor.  Do not drink on an empty stomach.  Keep yourself hydrated. Have water, diet soda, or unsweetened iced tea.  Regular soda, juice, and other mixers might contain a lot of carbohydrates and should be counted. WHAT FOODS ARE NOT RECOMMENDED? As you make food choices, it is important to remember that all foods are not the same. Some foods have fewer nutrients per serving than other foods, even though they might have the same number of calories or carbohydrates. It is difficult to get your body what it needs when you eat foods with fewer nutrients. Examples of foods that you should avoid that are high in calories and carbohydrates but low in nutrients include:  Trans fats (most processed foods list trans fats on the Nutrition Facts label).  Regular soda.  Juice.  Candy.  Sweets, such as cake, pie, doughnuts, and cookies.  Fried foods. WHAT FOODS CAN I EAT? Eat nutrient-rich foods, which will nourish your body and keep you healthy. The food you should eat also will depend on several factors, including:  The calories you need.  The medicines you take.  Your weight.  Your blood glucose level.  Your blood pressure level.  Your cholesterol level. You should eat a variety of foods, including:  Protein.  Lean cuts of meat.  Proteins low in saturated fats, such as fish, egg whites, and beans. Avoid processed meats.  Fruits and vegetables.  Fruits and vegetables that may help control blood glucose levels, such as apples, mangoes, and   yams.  Dairy products.  Choose fat-free or low-fat dairy products, such as milk, yogurt, and cheese.  Grains, bread, pasta, and rice.  Choose whole grain products, such as multigrain bread, whole oats, and brown rice. These foods may help control blood pressure.  Fats.  Foods containing healthful fats, such as nuts,  avocado, olive oil, canola oil, and fish. DOES EVERYONE WITH DIABETES MELLITUS HAVE THE SAME MEAL PLAN? Because every person with diabetes mellitus is different, there is not one meal plan that works for everyone. It is very important that you meet with a dietitian who will help you create a meal plan that is just right for you.   This information is not intended to replace advice given to you by your health care provider. Make sure you discuss any questions you have with your health care provider.   Document Released: 08/02/2005 Document Revised: 11/26/2014 Document Reviewed: 10/02/2013 Elsevier Interactive Patient Education 2016 Elsevier Inc.  

## 2016-10-01 ENCOUNTER — Other Ambulatory Visit: Payer: Self-pay | Admitting: Family

## 2016-10-01 DIAGNOSIS — E559 Vitamin D deficiency, unspecified: Secondary | ICD-10-CM

## 2016-10-01 LAB — ANEMIA PROFILE B
FERRITIN: 27 ng/mL (ref 15–150)
FOLATE: 11.6 ng/mL (ref >3.0)
IRON SATURATION: 14 % — AB (ref 15–55)
IRON: 46 ug/dL (ref 27–159)
TIBC: 340 ug/dL (ref 250–450)
UIBC: 294 ug/dL (ref 131–425)
VITAMIN B 12: 654 pg/mL (ref 211–946)

## 2016-10-01 LAB — CMP14+EGFR
ALK PHOS: 126 IU/L — AB (ref 39–117)
ALT: 34 IU/L — AB (ref 0–32)
AST: 25 IU/L (ref 0–40)
Albumin/Globulin Ratio: 1.4 (ref 1.2–2.2)
Albumin: 3.9 g/dL (ref 3.5–5.5)
BUN/Creatinine Ratio: 16 (ref 9–23)
BUN: 12 mg/dL (ref 6–24)
Bilirubin Total: 0.2 mg/dL (ref 0.0–1.2)
CO2: 23 mmol/L (ref 18–29)
CREATININE: 0.74 mg/dL (ref 0.57–1.00)
Calcium: 9.4 mg/dL (ref 8.7–10.2)
Chloride: 101 mmol/L (ref 96–106)
GFR calc Af Amer: 112 mL/min/{1.73_m2} (ref >59)
GFR calc non Af Amer: 97 mL/min/{1.73_m2} (ref >59)
GLUCOSE: 86 mg/dL (ref 65–99)
Globulin, Total: 2.8 g/dL (ref 1.5–4.5)
Potassium: 4.1 mmol/L (ref 3.5–5.2)
SODIUM: 138 mmol/L (ref 134–144)
Total Protein: 6.7 g/dL (ref 6.0–8.5)

## 2016-10-01 LAB — THYROID PANEL WITH TSH
Free Thyroxine Index: 1.6 (ref 1.2–4.9)
T3 Uptake Ratio: 22 % — ABNORMAL LOW (ref 24–39)
T4 TOTAL: 7.2 ug/dL (ref 4.5–12.0)
TSH: 0.927 u[IU]/mL (ref 0.450–4.500)

## 2016-10-01 LAB — VITAMIN D 25 HYDROXY (VIT D DEFICIENCY, FRACTURES): VIT D 25 HYDROXY: 18 ng/mL — AB (ref 30.0–100.0)

## 2016-10-01 MED ORDER — VITAMIN D (ERGOCALCIFEROL) 1.25 MG (50000 UNIT) PO CAPS: 50000.0000 [IU] | ORAL_CAPSULE | ORAL | 3 refills | Status: DC

## 2016-10-15 ENCOUNTER — Ambulatory Visit: Payer: Managed Care, Other (non HMO) | Admitting: Pediatrics

## 2016-10-16 ENCOUNTER — Encounter: Payer: Self-pay | Admitting: Family

## 2016-10-16 ENCOUNTER — Ambulatory Visit (INDEPENDENT_AMBULATORY_CARE_PROVIDER_SITE_OTHER): Payer: Managed Care, Other (non HMO) | Admitting: Family

## 2016-10-16 VITALS — BP 116/76 | HR 60 | Temp 97.3°F | Ht 62.0 in | Wt 233.0 lb

## 2016-10-16 DIAGNOSIS — IMO0001 Reserved for inherently not codable concepts without codable children: Secondary | ICD-10-CM

## 2016-10-16 DIAGNOSIS — Z6841 Body Mass Index (BMI) 40.0 and over, adult: Secondary | ICD-10-CM

## 2016-10-16 DIAGNOSIS — E669 Obesity, unspecified: Secondary | ICD-10-CM | POA: Diagnosis not present

## 2016-10-16 DIAGNOSIS — I10 Essential (primary) hypertension: Secondary | ICD-10-CM

## 2016-10-16 DIAGNOSIS — R0683 Snoring: Secondary | ICD-10-CM

## 2016-10-16 NOTE — Patient Instructions (Signed)
Hypertension Hypertension, commonly called high blood pressure, is when the force of blood pumping through your arteries is too strong. Your arteries are the blood vessels that carry blood from your heart throughout your body. A blood pressure reading consists of a higher number over a lower number, such as 110/72. The higher number (systolic) is the pressure inside your arteries when your heart pumps. The lower number (diastolic) is the pressure inside your arteries when your heart relaxes. Ideally you want your blood pressure below 120/80. Hypertension forces your heart to work harder to pump blood. Your arteries may become narrow or stiff. Having untreated or uncontrolled hypertension can cause heart attack, stroke, kidney disease, and other problems. What increases the risk? Some risk factors for high blood pressure are controllable. Others are not. Risk factors you cannot control include:  Race. You may be at higher risk if you are African American.  Age. Risk increases with age.  Gender. Men are at higher risk than women before age 45 years. After age 65, women are at higher risk than men. Risk factors you can control include:  Not getting enough exercise or physical activity.  Being overweight.  Getting too much fat, sugar, calories, or salt in your diet.  Drinking too much alcohol. What are the signs or symptoms? Hypertension does not usually cause signs or symptoms. Extremely high blood pressure (hypertensive crisis) may cause headache, anxiety, shortness of breath, and nosebleed. How is this diagnosed? To check if you have hypertension, your health care provider will measure your blood pressure while you are seated, with your arm held at the level of your heart. It should be measured at least twice using the same arm. Certain conditions can cause a difference in blood pressure between your right and left arms. A blood pressure reading that is higher than normal on one occasion does  not mean that you need treatment. If it is not clear whether you have high blood pressure, you may be asked to return on a different day to have your blood pressure checked again. Or, you may be asked to monitor your blood pressure at home for 1 or more weeks. How is this treated? Treating high blood pressure includes making lifestyle changes and possibly taking medicine. Living a healthy lifestyle can help lower high blood pressure. You may need to change some of your habits. Lifestyle changes may include:  Following the DASH diet. This diet is high in fruits, vegetables, and whole grains. It is low in salt, red meat, and added sugars.  Keep your sodium intake below 2,300 mg per day.  Getting at least 30-45 minutes of aerobic exercise at least 4 times per week.  Losing weight if necessary.  Not smoking.  Limiting alcoholic beverages.  Learning ways to reduce stress. Your health care provider may prescribe medicine if lifestyle changes are not enough to get your blood pressure under control, and if one of the following is true:  You are 18-59 years of age and your systolic blood pressure is above 140.  You are 60 years of age or older, and your systolic blood pressure is above 150.  Your diastolic blood pressure is above 90.  You have diabetes, and your systolic blood pressure is over 140 or your diastolic blood pressure is over 90.  You have kidney disease and your blood pressure is above 140/90.  You have heart disease and your blood pressure is above 140/90. Your personal target blood pressure may vary depending on your medical   conditions, your age, and other factors. Follow these instructions at home:  Have your blood pressure rechecked as directed by your health care provider.  Take medicines only as directed by your health care provider. Follow the directions carefully. Blood pressure medicines must be taken as prescribed. The medicine does not work as well when you skip  doses. Skipping doses also puts you at risk for problems.  Do not smoke.  Monitor your blood pressure at home as directed by your health care provider. Contact a health care provider if:  You think you are having a reaction to medicines taken.  You have recurrent headaches or feel dizzy.  You have swelling in your ankles.  You have trouble with your vision. Get help right away if:  You develop a severe headache or confusion.  You have unusual weakness, numbness, or feel faint.  You have severe chest or abdominal pain.  You vomit repeatedly.  You have trouble breathing. This information is not intended to replace advice given to you by your health care provider. Make sure you discuss any questions you have with your health care provider. Document Released: 11/05/2005 Document Revised: 04/12/2016 Document Reviewed: 08/28/2013 Elsevier Interactive Patient Education  2017 Elsevier Inc.  

## 2016-10-16 NOTE — Progress Notes (Signed)
   Subjective:    Patient ID: Deanna Long, female    DOB: April 23, 1971, 45 y.o.   MRN: [id number]  PT presents to the office today to recheck HTN. PT's BP is at goal. PT is complaining of snoring and feeling un rested during they day.  Hypertension  This is a chronic problem. The current episode started more than 1 year ago. The problem has been resolved since onset. The problem is uncontrolled. Pertinent negatives include no anxiety, headaches, palpitations, peripheral edema or shortness of breath. Risk factors for coronary artery disease include obesity, sedentary lifestyle and family history. Past treatments include ACE inhibitors and diuretics. The current treatment provides significant improvement. There is no history of kidney disease, CAD/MI, CVA or heart failure.      Review of Systems  Respiratory: Negative for shortness of breath.   Cardiovascular: Negative for palpitations.  Neurological: Negative for headaches.  All other systems reviewed and are negative.      Objective:   Physical Exam  Constitutional: She is oriented to person, place, and time. She appears well-developed and well-nourished. No distress.  HENT:  Head: Normocephalic.  Eyes: Pupils are equal, round, and reactive to light.  Cardiovascular: Normal rate, regular rhythm, normal heart sounds and intact distal pulses.   No murmur heard. Pulmonary/Chest: Effort normal and breath sounds normal. No respiratory distress. She has no wheezes.  Abdominal: Soft. Bowel sounds are normal. She exhibits no distension. There is no tenderness.  Musculoskeletal: Normal range of motion. She exhibits no edema or tenderness.  Neurological: She is alert and oriented to person, place, and time.  Skin: Skin is warm and dry.  Psychiatric: She has a normal mood and affect. Her behavior is normal. Judgment and thought content normal.  Vitals reviewed.     BP 116/76   Pulse 60   Temp 97.3 F (36.3 C) (Oral)   Ht '5\' 2"'$  (1.575 m)    Wt 233 lb (105.7 kg)   BMI 42.62 kg/m      Assessment & Plan:  1. Essential hypertension --Daily blood pressure log given with instructions on how to fill out and told to bring to next visit -Dash diet information given -Exercise encouraged - Stress Management  -Continue current meds -RTO in 6 months - BMP8+EGFR  2. Class 3 obesity without serious comorbidity with body mass index (BMI) of 40.0 to 44.9 in adult, unspecified obesity type (HCC) - BMP8+EGFR  3. Snoring  - BMP8+EGFR - Ambulatory referral to Sleep Studies   Continue all meds Labs pending Health Maintenance reviewed Diet and exercise encouraged RTO 6 months   Evelina Dun, FNP

## 2016-10-17 LAB — BMP8+EGFR
BUN/Creatinine Ratio: 21 (ref 9–23)
BUN: 14 mg/dL (ref 6–24)
CO2: 24 mmol/L (ref 18–29)
Calcium: 9.8 mg/dL (ref 8.7–10.2)
Chloride: 101 mmol/L (ref 96–106)
Creatinine, Ser: 0.68 mg/dL (ref 0.57–1.00)
GFR calc Af Amer: 120 mL/min/{1.73_m2} (ref >59)
GFR calc non Af Amer: 105 mL/min/{1.73_m2} (ref >59)
GLUCOSE: 76 mg/dL (ref 65–99)
POTASSIUM: 4.6 mmol/L (ref 3.5–5.2)
SODIUM: 142 mmol/L (ref 134–144)

## 2016-11-08 ENCOUNTER — Institutional Professional Consult (permissible substitution): Payer: Managed Care, Other (non HMO) | Admitting: Neurology

## 2016-12-06 ENCOUNTER — Institutional Professional Consult (permissible substitution): Payer: Managed Care, Other (non HMO) | Admitting: Neurology

## 2016-12-07 ENCOUNTER — Telehealth: Payer: Self-pay
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
## 2016-12-07 NOTE — Telephone Encounter (Signed)
I tried to call patient back, no answer and no vm

## 2016-12-10 NOTE — Telephone Encounter (Signed)
I tried to call the patient to r/s. But no answer and no vm.

## 2017-03-02 DIAGNOSIS — M79606 Pain in leg, unspecified: Secondary | ICD-10-CM | POA: Insufficient documentation

## 2017-03-06 DIAGNOSIS — S32401A Unspecified fracture of right acetabulum, initial encounter for closed fracture: Secondary | ICD-10-CM | POA: Insufficient documentation

## 2018-01-21 ENCOUNTER — Encounter: Payer: Managed Care, Other (non HMO) | Admitting: Family

## 2018-01-30 ENCOUNTER — Encounter: Payer: Self-pay | Admitting: Family

## 2018-01-30 ENCOUNTER — Ambulatory Visit (INDEPENDENT_AMBULATORY_CARE_PROVIDER_SITE_OTHER): Payer: BLUE CROSS/BLUE SHIELD | Admitting: Family

## 2018-01-30 VITALS — BP 178/122 | HR 63 | Temp 97.8°F | Ht 62.0 in | Wt 225.2 lb

## 2018-01-30 DIAGNOSIS — Z0000 Encounter for general adult medical examination without abnormal findings: Secondary | ICD-10-CM | POA: Diagnosis not present

## 2018-01-30 DIAGNOSIS — Z01419 Encounter for gynecological examination (general) (routine) without abnormal findings: Secondary | ICD-10-CM | POA: Diagnosis not present

## 2018-01-30 DIAGNOSIS — L989 Disorder of the skin and subcutaneous tissue, unspecified: Secondary | ICD-10-CM

## 2018-01-30 DIAGNOSIS — E559 Vitamin D deficiency, unspecified: Secondary | ICD-10-CM

## 2018-01-30 DIAGNOSIS — K219 Gastro-esophageal reflux disease without esophagitis: Secondary | ICD-10-CM

## 2018-01-30 DIAGNOSIS — I10 Essential (primary) hypertension: Secondary | ICD-10-CM

## 2018-01-30 DIAGNOSIS — Z23 Encounter for immunization: Secondary | ICD-10-CM | POA: Diagnosis not present

## 2018-01-30 LAB — URINALYSIS, COMPLETE
BILIRUBIN UA: NEGATIVE
Glucose, UA: NEGATIVE
KETONES UA: NEGATIVE
Nitrite, UA: NEGATIVE
PROTEIN UA: NEGATIVE
RBC UA: NEGATIVE
Specific Gravity, UA: 1.015 (ref 1.005–1.030)
Urobilinogen, Ur: 0.2 mg/dL (ref 0.2–1.0)
pH, UA: 7.5 (ref 5.0–7.5)

## 2018-01-30 LAB — MICROSCOPIC EXAMINATION
Epithelial Cells (non renal): >10 /hpf — AB (ref 0–10)
RENAL EPITHEL UA: NONE SEEN /HPF

## 2018-01-30 MED ORDER — LISINOPRIL-HYDROCHLOROTHIAZIDE 20-12.5 MG PO TABS: 1.0000 | ORAL_TABLET | Freq: Every day | ORAL | 3 refills | Status: DC

## 2018-01-30 MED ORDER — VITAMIN D (ERGOCALCIFEROL) 1.25 MG (50000 UNIT) PO CAPS: 50000.0000 [IU] | ORAL_CAPSULE | ORAL | 3 refills | Status: DC

## 2018-01-30 NOTE — Progress Notes (Signed)
Subjective:    Patient ID: Deanna Long, female    DOB: 1971-06-01, 47 y.o.   MRN: [id number]  PT presents to the office today for CPE and pap. PT states she has not been without her BP medications in months.  Hypertension  This is a chronic problem. The current episode started more than 1 year ago. The problem has been gradually worsening since onset. The problem is uncontrolled. Associated symptoms include headaches and malaise/fatigue. Pertinent negatives include no peripheral edema or shortness of breath. Risk factors for coronary artery disease include family history and obesity. Past treatments include nothing (has been out BP meds). The current treatment provides no improvement. There is no history of kidney disease, CAD/MI, CVA or heart failure.  Gastroesophageal Reflux  She reports no belching, no coughing or no heartburn. This is a chronic problem. The current episode started more than 1 year ago. The problem occurs occasionally. The problem has been waxing and waning. The symptoms are aggravated by certain foods. Risk factors include obesity. She has tried a diet change and an antacid for the symptoms. The treatment provided mild relief.      Review of Systems  Constitutional: Positive for malaise/fatigue.  Respiratory: Negative for cough and shortness of breath.   Gastrointestinal: Negative for heartburn.  Neurological: Positive for headaches.  All other systems reviewed and are negative.      Objective:   Physical Exam  Constitutional: She is oriented to person, place, and time. She appears well-developed and well-nourished. No distress.  HENT:  Head: Normocephalic and atraumatic.  Right Ear: External ear normal.  Left Ear: External ear normal.  Nose: Nose normal.  Mouth/Throat: Oropharynx is clear and moist.  Eyes: Pupils are equal, round, and reactive to light.  Neck: Normal range of motion. Neck supple. No thyromegaly present.  Cardiovascular: Normal rate, regular  rhythm, normal heart sounds and intact distal pulses.  No murmur heard. Pulmonary/Chest: Effort normal and breath sounds normal. No respiratory distress. She has no wheezes. Right breast exhibits no inverted nipple, no mass, no nipple discharge, no skin change and no tenderness. Left breast exhibits no inverted nipple, no mass, no nipple discharge, no skin change and no tenderness. Breasts are symmetrical.  Abdominal: Soft. Bowel sounds are normal. She exhibits no distension. There is no tenderness.  Genitourinary: Vagina normal.  Genitourinary Comments: Bimanual exam- no adnexal masses or tenderness, ovaries nonpalpable   Cervix parous and pink- No discharge   Musculoskeletal: Normal range of motion. She exhibits no edema or tenderness.  Neurological: She is alert and oriented to person, place, and time.  Skin: Skin is warm and dry.  Psychiatric: She has a normal mood and affect. Her behavior is normal. Judgment and thought content normal.  Vitals reviewed.     BP (!) 178/122   Pulse 63   Temp 97.8 F (36.6 C) (Oral)   Ht 5' 2"  (1.575 m)   Wt 225 lb 3.2 oz (102.2 kg)   BMI 41.19 kg/m      Assessment & Plan:  1. Normal gynecologic examination - Urinalysis, Complete - CMP14+EGFR - IGP, Aptima HPV, rfx 16/18,45  2. Essential hypertension Will reorder Zestoretic 20-12.5 mg today -Dash diet information given -Exercise encouraged - Stress Management  -Continue current meds -RTO in 2 weeks  - lisinopril-hydrochlorothiazide (ZESTORETIC) 20-12.5 MG tablet; Take 1 tablet by mouth daily.  Dispense: 90 tablet; Refill: 3 - CMP14+EGFR  3. Vitamin D deficiency - Vitamin D, Ergocalciferol, (DRISDOL) 50000 units CAPS capsule; Take  1 capsule (50,000 Units total) by mouth every 7 (seven) days.  Dispense: 12 capsule; Refill: 3 - CMP14+EGFR  4. Morbid obesity (HCC) - CMP14+EGFR  5. Gastroesophageal reflux disease, esophagitis presence not specified - CMP14+EGFR  6. Annual physical  exam - lisinopril-hydrochlorothiazide (ZESTORETIC) 20-12.5 MG tablet; Take 1 tablet by mouth daily.  Dispense: 90 tablet; Refill: 3 - Anemia Profile B - CMP14+EGFR - Lipid panel - TSH - VITAMIN D 25 Hydroxy (Vit-D Deficiency, Fractures) - IGP, Aptima HPV, rfx 16/18,45  7. Skin lesion - Ambulatory referral to Dermatology   Continue all meds Labs pending Health Maintenance reviewed Diet and exercise encouraged RTO 2 weeks to recheck HTN  Evelina Dun, FNP

## 2018-01-30 NOTE — Patient Instructions (Signed)

## 2018-01-30 NOTE — Addendum Note (Signed)
Addended byDory Peru: RINTELMANN, Lyzbeth C on: 01/30/2018 01:11 PM   Modules accepted: Orders

## 2018-01-31 LAB — ANEMIA PROFILE B
BASOS: 0 %
Basophils Absolute: 0 10*3/uL (ref 0.0–0.2)
EOS (ABSOLUTE): 0.1 10*3/uL (ref 0.0–0.4)
EOS: 1 %
FERRITIN: 39 ng/mL (ref 15–150)
Folate: 17.5 ng/mL (ref >3.0)
HEMATOCRIT: 40.5 % (ref 34.0–46.6)
Hemoglobin: 13.5 g/dL (ref 11.1–15.9)
IRON: 77 ug/dL (ref 27–159)
Immature Grans (Abs): 0 10*3/uL (ref 0.0–0.1)
Immature Granulocytes: 0 %
Iron Saturation: 23 % (ref 15–55)
Lymphocytes Absolute: 1.8 10*3/uL (ref 0.7–3.1)
Lymphs: 19 %
MCH: 29 pg (ref 26.6–33.0)
MCHC: 33.3 g/dL (ref 31.5–35.7)
MCV: 87 fL (ref 79–97)
MONOCYTES: 8 %
Monocytes Absolute: 0.8 10*3/uL (ref 0.1–0.9)
NEUTROS ABS: 6.7 10*3/uL (ref 1.4–7.0)
Neutrophils: 72 %
Platelets: 432 10*3/uL — ABNORMAL HIGH (ref 150–379)
RBC: 4.66 x10E6/uL (ref 3.77–5.28)
RDW: 13.5 % (ref 12.3–15.4)
RETIC CT PCT: 2 % (ref 0.6–2.6)
TIBC: 339 ug/dL (ref 250–450)
UIBC: 262 ug/dL (ref 131–425)
VITAMIN B 12: 518 pg/mL (ref 232–1245)
WBC: 9.4 10*3/uL (ref 3.4–10.8)

## 2018-01-31 LAB — CMP14+EGFR
A/G RATIO: 1.6 (ref 1.2–2.2)
ALT: 22 IU/L (ref 0–32)
AST: 16 IU/L (ref 0–40)
Albumin: 4.4 g/dL (ref 3.5–5.5)
Alkaline Phosphatase: 126 IU/L — ABNORMAL HIGH (ref 39–117)
BUN/Creatinine Ratio: 15 (ref 9–23)
BUN: 11 mg/dL (ref 6–24)
Bilirubin Total: 0.4 mg/dL (ref 0.0–1.2)
CALCIUM: 9.9 mg/dL (ref 8.7–10.2)
CO2: 22 mmol/L (ref 20–29)
CREATININE: 0.74 mg/dL (ref 0.57–1.00)
Chloride: 105 mmol/L (ref 96–106)
GFR, EST AFRICAN AMERICAN: 111 mL/min/{1.73_m2} (ref >59)
GFR, EST NON AFRICAN AMERICAN: 96 mL/min/{1.73_m2} (ref >59)
GLOBULIN, TOTAL: 2.8 g/dL (ref 1.5–4.5)
Glucose: 81 mg/dL (ref 65–99)
POTASSIUM: 4.4 mmol/L (ref 3.5–5.2)
Sodium: 140 mmol/L (ref 134–144)
TOTAL PROTEIN: 7.2 g/dL (ref 6.0–8.5)

## 2018-01-31 LAB — LIPID PANEL
CHOL/HDL RATIO: 3.9 ratio (ref 0.0–4.4)
Cholesterol, Total: 203 mg/dL — ABNORMAL HIGH (ref 100–199)
HDL: 52 mg/dL (ref >39)
LDL CALC: 131 mg/dL — AB (ref 0–99)
TRIGLYCERIDES: 101 mg/dL (ref 0–149)
VLDL Cholesterol Cal: 20 mg/dL (ref 5–40)

## 2018-01-31 LAB — TSH: TSH: 0.62 u[IU]/mL (ref 0.450–4.500)

## 2018-01-31 LAB — VITAMIN D 25 HYDROXY (VIT D DEFICIENCY, FRACTURES): Vit D, 25-Hydroxy: 21.5 ng/mL — ABNORMAL LOW (ref 30.0–100.0)

## 2018-02-03 ENCOUNTER — Other Ambulatory Visit: Payer: Self-pay | Admitting: Family

## 2018-02-03 DIAGNOSIS — E559 Vitamin D deficiency, unspecified: Secondary | ICD-10-CM

## 2018-02-03 MED ORDER — ATORVASTATIN CALCIUM 20 MG PO TABS: 20.0000 mg | ORAL_TABLET | Freq: Every day | ORAL | 11 refills | Status: DC

## 2018-02-03 MED ORDER — VITAMIN D (ERGOCALCIFEROL) 1.25 MG (50000 UNIT) PO CAPS: 50000.0000 [IU] | ORAL_CAPSULE | ORAL | 3 refills | Status: DC

## 2018-02-04 LAB — IGP, APTIMA HPV, RFX 16/18,45
HPV APTIMA: NEGATIVE
PAP SMEAR COMMENT: 0

## 2018-02-12 ENCOUNTER — Telehealth: Payer: Self-pay | Admitting: Family

## 2018-02-13 NOTE — Telephone Encounter (Signed)
Pt advised to call first thing Monday morning at 7:45 [facility] pt voiced understanding.

## 2018-02-14 ENCOUNTER — Ambulatory Visit: Payer: BLUE CROSS/BLUE SHIELD | Admitting: Family

## 2018-02-17 ENCOUNTER — Ambulatory Visit (INDEPENDENT_AMBULATORY_CARE_PROVIDER_SITE_OTHER): Payer: BLUE CROSS/BLUE SHIELD | Admitting: Family

## 2018-02-17 ENCOUNTER — Encounter: Payer: Self-pay | Admitting: Family

## 2018-02-17 VITALS — BP 119/79 | HR 78 | Temp 96.6°F | Ht 62.0 in | Wt 221.0 lb

## 2018-02-17 DIAGNOSIS — F411 Generalized anxiety disorder: Secondary | ICD-10-CM | POA: Insufficient documentation

## 2018-02-17 DIAGNOSIS — I10 Essential (primary) hypertension: Secondary | ICD-10-CM | POA: Diagnosis not present

## 2018-02-17 MED ORDER — BUPROPION HCL ER (SR) 150 MG PO TB12: 150.0000 mg | ORAL_TABLET | Freq: Two times a day (BID) | ORAL | 1 refills | Status: DC

## 2018-02-17 MED ORDER — ATORVASTATIN CALCIUM 20 MG PO TABS: 20.0000 mg | ORAL_TABLET | Freq: Every day | ORAL | 1 refills | Status: AC

## 2018-02-17 NOTE — Addendum Note (Signed)
Addended by: Jannifer RodneyHAWKS, Jessiah Wojnar A on: 02/17/2018 11:13 AM   Modules accepted: Orders

## 2018-02-17 NOTE — Progress Notes (Signed)
   Subjective:    Patient ID: Deanna Long, female    DOB: 03-07-1971, 47 y.o.   MRN: [id number]  PT presents to the office today B/P today. Pt's BP is at goal today! Hypertension  This is a chronic problem. The current episode started more than 1 year ago. The problem has been resolved since onset. The problem is controlled. Associated symptoms include anxiety. Pertinent negatives include no headaches, peripheral edema or shortness of breath. The current treatment provides moderate improvement. There is no history of kidney disease, CAD/MI, CVA or heart failure.  Anxiety  Presents for follow-up visit. Symptoms include decreased concentration, excessive worry, irritability and nervous/anxious behavior. Patient reports no shortness of breath. Symptoms occur most days. The severity of symptoms is moderate. The quality of sleep is good.        Review of Systems  Constitutional: Positive for irritability.  Respiratory: Negative for shortness of breath.   Neurological: Negative for headaches.  Psychiatric/Behavioral: Positive for decreased concentration. The patient is nervous/anxious.   All other systems reviewed and are negative.      Objective:   Physical Exam  Constitutional: She is oriented to person, place, and time. She appears well-developed and well-nourished. No distress.  HENT:  Head: Normocephalic.  Eyes: Pupils are equal, round, and reactive to light.  Neck: Normal range of motion. Neck supple. No thyromegaly present.  Cardiovascular: Normal rate, regular rhythm, normal heart sounds and intact distal pulses.  No murmur heard. Pulmonary/Chest: Effort normal and breath sounds normal. No respiratory distress. She has no wheezes.  Abdominal: Soft. Bowel sounds are normal. She exhibits no distension. There is no tenderness.  Musculoskeletal: Normal range of motion. She exhibits no edema or tenderness.  Neurological: She is alert and oriented to person, place, and time.  Skin: Skin  is warm and dry.  Psychiatric: She has a normal mood and affect. Her behavior is normal. Judgment and thought content normal.  Vitals reviewed.    BP 119/79   Pulse 78   Temp (!) 96.6 F (35.9 C) (Oral)   Ht '5\' 2"'$  (1.575 m)   Wt 221 lb (100.2 kg)   BMI 40.42 kg/m      Assessment & Plan:  1. Essential hypertension At goal! Keep up the great work -[employer] given -Exercise encouraged - Stress Management  -Continue current meds - BMP8+EGFR  2. Morbid obesity (HCC) - BMP8+EGFR  3. GAD (generalized anxiety disorder) Wellbutrin 150 mg started today  Stress management discussed RTO in 6 weeks - BMP8+EGFR - buPROPion (WELLBUTRIN SR) 150 MG 12 hr tablet; Take 1 tablet (150 mg total) by mouth 2 (two) times daily.  Dispense: 180 tablet; Refill: [place], FNP

## 2018-02-17 NOTE — Patient Instructions (Signed)

## 2018-02-18 LAB — BMP8+EGFR
BUN / CREAT RATIO: 17 (ref 9–23)
BUN: 13 mg/dL (ref 6–24)
CHLORIDE: 102 mmol/L (ref 96–106)
CO2: 19 mmol/L — ABNORMAL LOW (ref 20–29)
CREATININE: 0.78 mg/dL (ref 0.57–1.00)
Calcium: 10.2 mg/dL (ref 8.7–10.2)
GFR calc Af Amer: 104 mL/min/{1.73_m2} (ref >59)
GFR calc non Af Amer: 90 mL/min/{1.73_m2} (ref >59)
GLUCOSE: 99 mg/dL (ref 65–99)
Potassium: 3.9 mmol/L (ref 3.5–5.2)
SODIUM: 142 mmol/L (ref 134–144)

## 2019-12-12 DIAGNOSIS — R42 Dizziness and giddiness: Secondary | ICD-10-CM | POA: Insufficient documentation

## 2019-12-12 DIAGNOSIS — Z91148 Patient's other noncompliance with medication regimen for other reason: Secondary | ICD-10-CM | POA: Insufficient documentation

## 2019-12-12 DIAGNOSIS — I10 Essential (primary) hypertension: Secondary | ICD-10-CM | POA: Insufficient documentation

## 2021-09-20 ENCOUNTER — Other Ambulatory Visit: Payer: Self-pay | Admitting: Internal Medicine

## 2021-09-20 DIAGNOSIS — Z1231 Encounter for screening mammogram for malignant neoplasm of breast: Secondary | ICD-10-CM

## 2021-11-16 ENCOUNTER — Ambulatory Visit
Admission: RE | Admit: 2021-11-16 | Discharge: 2021-11-16 | Disposition: A | Discharge status: Home / Self Care | Payer: BC Managed Care – PPO | Source: Ambulatory Visit | Attending: Internal Medicine | Admitting: Internal Medicine

## 2021-11-16 DIAGNOSIS — Z1231 Encounter for screening mammogram for malignant neoplasm of breast: Secondary | ICD-10-CM

## 2021-11-17 ENCOUNTER — Other Ambulatory Visit: Payer: Self-pay | Admitting: Internal Medicine

## 2021-11-17 DIAGNOSIS — R928 Other abnormal and inconclusive findings on diagnostic imaging of breast: Secondary | ICD-10-CM

## 2021-12-12 ENCOUNTER — Ambulatory Visit
Admission: RE | Admit: 2021-12-12 | Discharge: 2021-12-12 | Disposition: A | Discharge status: Home / Self Care | Payer: BC Managed Care – PPO | Source: Ambulatory Visit | Attending: Internal Medicine | Admitting: Internal Medicine

## 2021-12-12 ENCOUNTER — Ambulatory Visit: Payer: BC Managed Care – PPO

## 2021-12-12 DIAGNOSIS — R928 Other abnormal and inconclusive findings on diagnostic imaging of breast: Secondary | ICD-10-CM

## 2021-12-27 ENCOUNTER — Other Ambulatory Visit: Payer: BC Managed Care – PPO

## 2022-08-01 ENCOUNTER — Ambulatory Visit (INDEPENDENT_AMBULATORY_CARE_PROVIDER_SITE_OTHER): Payer: BC Managed Care – PPO

## 2022-08-01 ENCOUNTER — Encounter: Payer: Self-pay | Admitting: Orthopaedic Surgery

## 2022-08-01 ENCOUNTER — Ambulatory Visit: Payer: BC Managed Care – PPO | Admitting: Orthopaedic Surgery

## 2022-08-01 VITALS — BP 144/82 | HR 62 | Ht 62.0 in | Wt 173.0 lb

## 2022-08-01 DIAGNOSIS — M7061 Trochanteric bursitis, right hip: Secondary | ICD-10-CM | POA: Diagnosis not present

## 2022-08-01 DIAGNOSIS — G8929 Other chronic pain: Secondary | ICD-10-CM

## 2022-08-01 DIAGNOSIS — M545 Low back pain, unspecified: Secondary | ICD-10-CM | POA: Diagnosis not present
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
## 2022-08-03 DIAGNOSIS — M7061 Trochanteric bursitis, right hip: Secondary | ICD-10-CM | POA: Insufficient documentation

## 2022-08-03 MED ORDER — BUPIVACAINE HCL 0.25 % IJ SOLN
2.0000 mL | INTRAMUSCULAR | Status: AC | PRN
  Administered 2022-08-01: 2 mL via INTRA_ARTICULAR

## 2022-08-03 MED ORDER — METHYLPREDNISOLONE ACETATE 40 MG/ML IJ SUSP
40.0000 mg | INTRAMUSCULAR | Status: AC | PRN
  Administered 2022-08-01: 40 mg via INTRA_ARTICULAR

## 2022-08-03 MED ORDER — LIDOCAINE HCL 1 % IJ SOLN
1.0000 mL | INTRAMUSCULAR | Status: AC | PRN
  Administered 2022-08-01: 1 mL

## 2022-08-16 ENCOUNTER — Ambulatory Visit (INDEPENDENT_AMBULATORY_CARE_PROVIDER_SITE_OTHER): Payer: BC Managed Care – PPO

## 2022-08-16 ENCOUNTER — Ambulatory Visit: Payer: BC Managed Care – PPO | Admitting: Podiatry

## 2022-08-16 ENCOUNTER — Encounter: Payer: Self-pay | Admitting: Podiatry

## 2022-08-16 DIAGNOSIS — M2010 Hallux valgus (acquired), unspecified foot: Secondary | ICD-10-CM

## 2022-08-16 DIAGNOSIS — M2012 Hallux valgus (acquired), left foot: Secondary | ICD-10-CM

## 2022-08-16 DIAGNOSIS — M2011 Hallux valgus (acquired), right foot: Secondary | ICD-10-CM

## 2022-08-18 NOTE — Progress Notes (Signed)
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 Barkley Bruns treating nerve issue in right foot with injections-last injection early Sept. 2023   New Patient (Initial Visit)    51 y.o. female presents with the above complaint.   ROS: Denies fever chills nausea vomiting muscle aches pains calf pain back pain chest pain shortness of breath.  Past Medical History:  Diagnosis Date   Arthritis    GERD (gastroesophageal reflux disease)    Obesity    Superficial fungus infection of skin 08/23/2015   Past Surgical History:  Procedure Laterality Date   BREAST BIOPSY Left 12/24/2012   2 areas   CESAREAN SECTION     TUBAL LIGATION      Current Outpatient Medications:    acetaminophen (TYLENOL) 500 MG tablet, Take 1,000 mg by mouth as needed., Disp: , Rfl:    ALPRAZolam (XANAX) 0.25 MG tablet, Take 0.25 mg by mouth daily as needed., Disp: , Rfl:    atorvastatin (LIPITOR) 20 MG tablet, Take 1 tablet (20 mg total) by mouth daily., Disp: 90 tablet, Rfl: 1   esomeprazole (NEXIUM) 40 MG capsule, Take 40 mg by mouth as needed. , Disp: , Rfl:    estradiol (ESTRACE) 1 MG tablet, Take 1 mg by mouth daily., Disp: , Rfl:    lisinopril-hydrochlorothiazide (ZESTORETIC) 20-12.5 MG tablet, Take 1 tablet by mouth daily., Disp: 90 tablet, Rfl: 3   phentermine (ADIPEX-P) 37.5 MG tablet, Take 37.5 mg by mouth every morning., Disp: , Rfl:    progesterone (PROMETRIUM) 100 MG capsule, Take 100 mg by mouth daily., Disp: , Rfl:   No Known Allergies Review of Systems Objective:  There were no vitals filed for this visit.  General: Well developed, nourished, in no acute distress, alert and oriented x3   Dermatological: Skin is warm, dry and supple bilateral. Nails x 10 are well maintained; remaining integument appears unremarkable at this time.  There are no open sores, no preulcerative lesions, no rash or signs of infection present.  Vascular: Dorsalis Pedis artery and Posterior Tibial artery pedal pulses are 2/4 bilateral with immedate capillary fill time. Pedal hair growth present. No varicosities and no lower extremity edema present bilateral.   Neruologic: Grossly intact via light touch bilateral. Vibratory intact via tuning fork bilateral. Protective threshold with Semmes Wienstein monofilament intact to all pedal sites bilateral. Patellar and Achilles deep tendon reflexes 2+ bilateral. No Babinski or clonus noted bilateral.   Musculoskeletal: No gross boney pedal deformities bilateral. No pain, crepitus, or limitation noted with foot and ankle range of motion bilateral. Muscular strength 5/5 in all groups tested bilateral.  Hallux abductovalgus deformity of the left foot with limited range of motion with dorsiflexion first metatarsophalangeal joint prominent hypertrophic medial condyle to the head of that first metatarsal resulting in pain on palpation.  Juxtaposition of the hallux and second toes resulting hammertoe deformity of the second toe with some early osteoarthritic changes at the level of the PIPJ on palpation.  Gait: Unassisted, Nonantalgic.    Radiograph:    Radiographs taken today demonstrate an osseously mature individual hallux abductovalgus deformity is noted left.  Right foot demonstrates an osseously mature capital osteotomy of the first metatarsal repair with 2 K wires which is intact and with good alignment.  Left does demonstrate hallux valgus  deformity greater than normal value hallux abductus angle greater than normal value early osteoarthritic changes.  Hammertoe deformity second.  No significant acute findings.  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  Plan: Discussed in great detail today due to the symptomatology that she is experiencing and the  inability to wear appropriate shoe gear will go to go ahead and get her scheduled for a [place] bunion repair of her right foot and hammertoe repair of the second toe.  I answered all the questions regarding this procedure to the best my ability today in layman's terms she understood and was amenable to and signed a 3 pages of the consent form.  We did discuss the possible postop complications which may include but not limited to postop pain bleeding swelling infection recurrence need for further surgery overcorrection under correction also digit loss of limb loss of life.  She was provided information regarding the surgery center anesthesia group and provided a cam boot.  I will follow-up with her in the near future for surgical intervention.     Maribeth Jiles T. [place], [place]

## 2022-09-05 ENCOUNTER — Telehealth: Payer: Self-pay | Admitting: Podiatry

## 2022-09-05 NOTE — Telephone Encounter (Addendum)
DOS: 10/05/2022  [employer]  Procedures: [employer] [place] [employer] [phone]) and [employer] [phone]) DX: M20.12 and M20.42  Deductible: $2,000 with $0 met Out-of-Pocket: $7,000 with $6,286 remaining CoInsurance: 0%  Prior Authorization is Required per [employer].  Case #: [id number]  Authorized from 10/05/2022 - 12/03/2022  [employer] did not authorize patient's surgery.  Peer to Peer has been scheduled with Dr. Jacqualyn Posey on Tuesday 09/11/22 at 11:45 am.  He will be speaking with Dr. Earl Gala.  Call Reference #: [id number]

## 2022-09-21 ENCOUNTER — Ambulatory Visit: Payer: BC Managed Care – PPO | Admitting: Orthopaedic Surgery

## 2022-09-21 DIAGNOSIS — M7061 Trochanteric bursitis, right hip: Secondary | ICD-10-CM
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
## 2022-10-03 ENCOUNTER — Other Ambulatory Visit: Payer: Self-pay | Admitting: Podiatry

## 2022-10-03 MED ORDER — CEPHALEXIN 500 MG PO CAPS: 500.0000 mg | ORAL_CAPSULE | Freq: Three times a day (TID) | ORAL | 0 refills | Status: DC

## 2022-10-03 MED ORDER — OXYCODONE-ACETAMINOPHEN 10-325 MG PO TABS: 1.0000 | ORAL_TABLET | Freq: Three times a day (TID) | ORAL | 0 refills | Status: AC | PRN

## 2022-10-03 MED ORDER — ONDANSETRON HCL 4 MG PO TABS: 4.0000 mg | ORAL_TABLET | Freq: Three times a day (TID) | ORAL | 0 refills | Status: DC | PRN

## 2022-10-05 DIAGNOSIS — M2012 Hallux valgus (acquired), left foot: Secondary | ICD-10-CM | POA: Diagnosis not present

## 2022-10-05 DIAGNOSIS — M2042 Other hammer toe(s) (acquired), left foot: Secondary | ICD-10-CM | POA: Diagnosis not present

## 2022-10-16 ENCOUNTER — Encounter: Payer: Self-pay | Admitting: Podiatry

## 2022-10-16 ENCOUNTER — Ambulatory Visit (INDEPENDENT_AMBULATORY_CARE_PROVIDER_SITE_OTHER): Payer: BC Managed Care – PPO

## 2022-10-16 ENCOUNTER — Encounter: Payer: BC Managed Care – PPO | Admitting: Podiatry

## 2022-10-16 ENCOUNTER — Ambulatory Visit (INDEPENDENT_AMBULATORY_CARE_PROVIDER_SITE_OTHER): Payer: BC Managed Care – PPO | Admitting: Podiatry

## 2022-10-16 DIAGNOSIS — M2012 Hallux valgus (acquired), left foot: Secondary | ICD-10-CM

## 2022-10-16 DIAGNOSIS — M2010 Hallux valgus (acquired), unspecified foot: Secondary | ICD-10-CM

## 2022-10-16 DIAGNOSIS — M79676 Pain in unspecified toe(s): Secondary | ICD-10-CM

## 2022-10-16 DIAGNOSIS — M2042 Other hammer toe(s) (acquired), left foot: Secondary | ICD-10-CM

## 2022-10-16 DIAGNOSIS — Z9889 Other specified postprocedural states: Secondary | ICD-10-CM

## 2022-10-16 NOTE — Progress Notes (Signed)
She presents today for her first postop visit date of surgery 10/05/2022 Deanna Long osteotomy and a hammertoe repair second left with pin.  She states has been doing fine no problems whatsoever.  Objective: Vital signs are stable she is alert and oriented x 3 dry sterile dressing intact once removed demonstrates K wires intact sutures are intact margins are coapting though the ones on the toe still have a little bit of healing to do.  Over the first metatarsophalangeal joint appears to be healing very well.  Radiographs taken today demonstrate a well-healing surgical foot osteotomy is in good position and the screws in good position as well as the pin.  Assessment: Well-healing surgical foot left.  Plan: Redressed today dressed a compressive dressing placed her back in her cam boot and I will follow-up with her in 1 to 2 weeks for suture removal and placing her in a Darco shoe.

## 2022-10-19 ENCOUNTER — Other Ambulatory Visit: Payer: Self-pay | Admitting: Internal Medicine

## 2022-10-19 DIAGNOSIS — Z1231 Encounter for screening mammogram for malignant neoplasm of breast: Secondary | ICD-10-CM

## 2022-10-23 ENCOUNTER — Encounter: Payer: Self-pay | Admitting: Podiatry

## 2022-10-23 ENCOUNTER — Ambulatory Visit (INDEPENDENT_AMBULATORY_CARE_PROVIDER_SITE_OTHER): Payer: BC Managed Care – PPO | Admitting: Podiatry

## 2022-10-23 VITALS — BP 170/80 | HR 81

## 2022-10-23 DIAGNOSIS — M2012 Hallux valgus (acquired), left foot: Secondary | ICD-10-CM

## 2022-10-23 DIAGNOSIS — M2042 Other hammer toe(s) (acquired), left foot: Secondary | ICD-10-CM

## 2022-10-23 DIAGNOSIS — Z9889 Other specified postprocedural states: Secondary | ICD-10-CM

## 2022-10-23 NOTE — Progress Notes (Signed)
She presents today for postop visit #2 date of surgery 10/05/2022 [facility] bunionectomy with hammertoe repair second digit with pinning left foot.  Objective: Vital signs demonstrate mild hypertension blood pressure is 170/80 sitting left arm.  Dressel dressing was removed once removed demonstrates no erythema edema cellulitis drainage or odor sutures are intact margins are well coapted.  K wires intact second digit left foot.  She has great range of motion metatarsophalangeal joints.  Assessment: Well-healing surgical foot mild hypertension.  Plan: Discussed etiology pathology and surgical therapies at this point sutures removed today placed her in a compression dressing and a Darco shoe will follow-up with her in 2 weeks for another set of x-rays.

## 2022-11-06 ENCOUNTER — Encounter: Payer: BC Managed Care – PPO | Admitting: Podiatry

## 2022-11-08 ENCOUNTER — Ambulatory Visit (INDEPENDENT_AMBULATORY_CARE_PROVIDER_SITE_OTHER): Payer: BC Managed Care – PPO | Admitting: Podiatry

## 2022-11-08 ENCOUNTER — Ambulatory Visit (INDEPENDENT_AMBULATORY_CARE_PROVIDER_SITE_OTHER): Payer: BC Managed Care – PPO

## 2022-11-08 ENCOUNTER — Encounter: Payer: Self-pay | Admitting: Podiatry

## 2022-11-08 DIAGNOSIS — M2012 Hallux valgus (acquired), left foot: Secondary | ICD-10-CM

## 2022-11-08 DIAGNOSIS — Z9889 Other specified postprocedural states: Secondary | ICD-10-CM

## 2022-11-08 DIAGNOSIS — M2042 Other hammer toe(s) (acquired), left foot: Secondary | ICD-10-CM

## 2022-11-08 NOTE — Progress Notes (Signed)
She presents today date of surgery 10/05/2022 [facility] bunionectomy hammertoe repair second left with K wire intact she states is feeling pretty good she denies fever chills nausea vomit muscle aches pains calf pain back pain chest pain shortness of breath.  Objective: Vital signs stable oriented x 3 sterile dressing intact was removed demonstrates K wires intact to the toe toe is rectus with the rectus hallux.  Radiographs taken today demonstrate a rectus hallux well-healing osteotomies.  It appears that the wire is slightly distracted.  It appears that the PIPJ fusion is complete.  Assessment: Well-healing surgical foot.  Plan: Remove the wire today it was removed in total.  And disposed of.  I redressed the foot with a light dressing and I will follow-up with her in 2 to 3 weeks.  Encouraged range of motion activity also encouraged tennis shoe use after 1 week

## 2022-11-20 ENCOUNTER — Encounter: Payer: BC Managed Care – PPO | Admitting: Podiatry

## 2022-11-22 ENCOUNTER — Ambulatory Visit (INDEPENDENT_AMBULATORY_CARE_PROVIDER_SITE_OTHER): Payer: BC Managed Care – PPO | Admitting: Podiatry

## 2022-11-22 ENCOUNTER — Encounter: Payer: Self-pay | Admitting: Podiatry

## 2022-11-22 ENCOUNTER — Encounter: Payer: BC Managed Care – PPO | Admitting: Podiatry

## 2022-11-22 ENCOUNTER — Ambulatory Visit (INDEPENDENT_AMBULATORY_CARE_PROVIDER_SITE_OTHER): Payer: BC Managed Care – PPO

## 2022-11-22 VITALS — BP 146/91 | HR 80

## 2022-11-22 DIAGNOSIS — M2042 Other hammer toe(s) (acquired), left foot: Secondary | ICD-10-CM

## 2022-11-22 DIAGNOSIS — M2012 Hallux valgus (acquired), left foot: Secondary | ICD-10-CM | POA: Diagnosis not present

## 2022-11-22 DIAGNOSIS — Z9889 Other specified postprocedural states: Secondary | ICD-10-CM

## 2022-11-22 NOTE — Progress Notes (Signed)
She presents today for a follow-up of her surgery date of surgery was 10/05/2022 [facility] bunionectomy hammertoe repair second with K wire.  She denies fever chills nausea vomit muscle aches pains.  Objective: Vital signs stable tellurian x 3 there is no erythematous mild edema no cellulitis drainage or odor incision sites: Healed uneventfully.  She has good range of motion of the toe though radiographically it does demonstrate that is starting to heal in areas with secondary intent so most likely internal fixation is somewhat loose though it has not backed up dorsally.  I see no fracture of the osteotomy.  Assessment: Well-healing surgical foot concerned of the secondary bone healing.  Plan: Allow her to still be on her foot at the limited time obviously keep foot elevated is much as possible stay off of is much as possible and I will follow-up with her in 1 month for another set of x-rays

## 2022-12-10 ENCOUNTER — Encounter: Payer: Self-pay | Admitting: Podiatry

## 2022-12-17 ENCOUNTER — Ambulatory Visit (INDEPENDENT_AMBULATORY_CARE_PROVIDER_SITE_OTHER): Payer: BC Managed Care – PPO

## 2022-12-17 ENCOUNTER — Ambulatory Visit (INDEPENDENT_AMBULATORY_CARE_PROVIDER_SITE_OTHER): Payer: BC Managed Care – PPO | Admitting: Podiatry

## 2022-12-17 ENCOUNTER — Encounter: Payer: Self-pay | Admitting: Podiatry

## 2022-12-17 DIAGNOSIS — M2012 Hallux valgus (acquired), left foot: Secondary | ICD-10-CM | POA: Diagnosis not present

## 2022-12-17 DIAGNOSIS — M2042 Other hammer toe(s) (acquired), left foot: Secondary | ICD-10-CM

## 2022-12-17 DIAGNOSIS — Z9889 Other specified postprocedural states: Secondary | ICD-10-CM

## 2022-12-17 NOTE — Progress Notes (Signed)
She presents today date of surgery 10/05/2022 shortening osteotomy of the first metatarsal with hammertoe repair second left.  She states that seems to be doing good and just swells quite a bit but I am back at work.  She states other than that is doing quite well.  Objective: Vital signs stable oriented x 3 pulses are palpable.  She does have come considerable swelling along the first intermetatarsal space of the left foot.  Radiographs taken today demonstrate an osseously mature individual with secondary bone healing but the screw does not appear to be loosening anymore than it was previously.  Assessment: Well-healing surgical foot.  Plan: Follow-up with her in 1 month at which time I suppose we will be done with her provide she has not regressed.

## 2022-12-20 ENCOUNTER — Encounter: Payer: BC Managed Care – PPO | Admitting: Podiatry

## 2023-01-09 ENCOUNTER — Encounter: Payer: Self-pay | Admitting: Podiatry

## 2023-01-14 ENCOUNTER — Encounter: Payer: BC Managed Care – PPO | Admitting: Podiatry

## 2023-01-15 ENCOUNTER — Ambulatory Visit: Payer: BC Managed Care – PPO

## 2023-01-21 ENCOUNTER — Ambulatory Visit (INDEPENDENT_AMBULATORY_CARE_PROVIDER_SITE_OTHER): Payer: BC Managed Care – PPO | Admitting: Podiatry

## 2023-01-21 DIAGNOSIS — M2012 Hallux valgus (acquired), left foot: Secondary | ICD-10-CM | POA: Diagnosis not present

## 2023-01-21 NOTE — Progress Notes (Signed)
   Chief Complaint  Patient presents with   Routine Post Op    Patient came in today for left foot post op, patient denies any pain, patient does have some swelling     Subjective:  Patient presents today status post shortening osteotomy of the first metatarsal with hammertoe repair second left.  DOS:10/05/2022.  Dr. Milinda Pointer.  Patient doing well.  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  She has no pain or tenderness to the left foot however.  Past Medical History:  Diagnosis Date   Arthritis    GERD (gastroesophageal reflux disease)    Obesity    Superficial fungus infection of skin 08/23/2015    Past Surgical History:  Procedure Laterality Date   BREAST BIOPSY Left 12/24/2012   2 areas   CESAREAN SECTION     TUBAL LIGATION      No Known Allergies  Objective/Physical Exam Neurovascular status intact.  Incisions healed.  Moderate edema noted to the bilateral lower extremities and ankles.  There is no pain on palpation or range of motion to the first or second MTP.  Muscle strength 5/5 all compartments  Assessment: 1. s/p first metatarsal with hammertoe repair second left.  DOS:10/05/2022.  Dr. Milinda Pointer.  -Patient evaluated -Patient may now resume full activity no restrictions -Recommend good supportive shoes and sneakers -Return to clinic as needed   Edrick Kins, DPM [facility]  Dr. Edrick Kins, DPM    [address], [place] [address]                Office [phone]  Fax [phone]

## 2023-04-02 ENCOUNTER — Encounter (HOSPITAL_COMMUNITY): Payer: Self-pay | Admitting: Surgery

## 2023-04-02 NOTE — Progress Notes (Signed)
Surgery orders requested via Epic inbox. °

## 2023-04-03 ENCOUNTER — Ambulatory Visit: Payer: Self-pay | Admitting: Surgery

## 2023-04-03 DIAGNOSIS — Z01818 Encounter for other preprocedural examination: Secondary | ICD-10-CM

## 2023-04-04 ENCOUNTER — Other Ambulatory Visit: Payer: Self-pay

## 2023-04-04 ENCOUNTER — Encounter (HOSPITAL_COMMUNITY): Payer: Self-pay | Admitting: *Deleted

## 2023-04-04 ENCOUNTER — Encounter (HOSPITAL_COMMUNITY): Payer: Self-pay

## 2023-04-04 NOTE — Progress Notes (Addendum)
COVID Vaccine Completed:  Yes x3  Date of COVID positive in last 90 days:  No  PCP - Ralene Ok, MD (office note on chart) Cardiologist - N/A  Chest x-ray - 08-30-22 [facility] EKG - 03-12-23 copy on chart Stress Test -  N/A ECHO - Yes, in [place] several years ago, pt does not remember location where it was completed Cardiac Cath -  N/A Pacemaker/ICD device last checked: Spinal Cord Stimulator: N/A  Bowel Prep -  N/A  Sleep Study - Yes home test, +sleep apnea CPAP - No  Fasting Blood Sugar - N/A Checks Blood Sugar _____ times a day  Last dose of GLP1 agonist-  N/A GLP1 instructions:  N/A   Last dose of SGLT-2 inhibitors-  N/A SGLT-2 instructions: N/A  Blood Thinner Instructions: N/A Aspirin Instructions: Last Dose:  Activity level:  Can go up a flight of stairs and perform activities of daily living without stopping and without symptoms of chest pain or shortness of breath.  Anesthesia review:   Heart murmur.  Patient states that she was told she had a soft murmur in the past.  States that she had an echo several years ago and no abnormalities found   Patient denies shortness of breath, fever, cough and chest pain at PAT appointment (completed over the phone)  Patient verbalized understanding of instructions that were given to them at the PAT appointment. Patient was also instructed that they will need to review over the PAT instructions again at home before surgery.

## 2023-04-04 NOTE — Patient Instructions (Signed)
SURGICAL WAITING ROOM VISITATION Patients having surgery or a procedure may have no more than 2 support people in the waiting area - these visitors may rotate.    Children under the age of 79 must have an adult with them who is not the patient.  If the patient needs to stay at the hospital during part of their recovery, the visitor guidelines for inpatient rooms apply. Pre-op nurse will coordinate an appropriate time for 1 support person to accompany patient in pre-op.  This support person may not rotate.    Please refer to the [facility] website for the visitor guidelines for Inpatients (after your surgery is over and you are in a regular room).       Your procedure is scheduled on: 04-08-23   Report to [facility] Main Entrance    Report to admitting at 9:45 AM   Call this number if you have problems the morning of surgery [phone]   Do not eat food or drink liquids :After Midnight.            If you have questions, please contact your surgeon's office.   FOLLOW  ANY ADDITIONAL PRE OP INSTRUCTIONS YOU RECEIVED FROM YOUR SURGEON'S OFFICE!!!     Oral Hygiene is also important to reduce your risk of infection.                                    Remember - BRUSH YOUR TEETH THE MORNING OF SURGERY WITH YOUR REGULAR TOOTHPASTE   Do NOT smoke after Midnight   Take these medicines the morning of surgery with A SIP OF WATER:   Atorvastatin  Protonix  Alprazolam, Ondansetron and Tylenol if needed                              You may not have any metal on your body including hair pins, jewelry, and body piercing             Do not wear make-up, lotions, powders, perfumes or deodorant  Do not wear nail polish including gel and S&S, artificial/acrylic nails, or any other type of covering on natural nails including finger and toenails. If you have artificial nails, gel coating, etc. that needs to be removed by a nail salon please have this removed prior to surgery or surgery  may need to be canceled/ delayed if the surgeon/ anesthesia feels like they are unable to be safely monitored.   Do not shave  48 hours prior to surgery.    Do not bring valuables to the hospital. [facility] IS NOT RESPONSIBLE   FOR VALUABLES.   Contacts, dentures or bridgework may not be worn into surgery.  DO NOT BRING YOUR HOME MEDICATIONS TO THE HOSPITAL. PHARMACY WILL DISPENSE MEDICATIONS LISTED ON YOUR MEDICATION LIST TO YOU DURING YOUR ADMISSION IN THE HOSPITAL!    Patients discharged on the day of surgery will not be allowed to drive home.  Someone NEEDS to stay with you for the first 24 hours after anesthesia.              Please read over the following fact sheets you were given: IF YOU HAVE QUESTIONS ABOUT YOUR PRE-OP INSTRUCTIONS PLEASE CALL [phone]  If you received a COVID test during your pre-op visit  it is requested that you wear a mask when out in public,  stay away from anyone that may not be feeling well and notify your surgeon if you develop symptoms. If you test positive for Covid or have been in contact with anyone that has tested positive in the last 10 days please notify you surgeon.  [place] - Preparing for Surgery (Use antibacterial soap if you do not have CHG)  Before surgery, you can play an important role.  Because skin is not sterile, your skin needs to be as free of germs as possible.  You can reduce the number of germs on your skin by washing with CHG (chlorahexidine gluconate) soap before surgery.  CHG is an antiseptic cleaner which kills germs and bonds with the skin to continue killing germs even after washing. Please DO NOT use if you have an allergy to CHG or antibacterial soaps.  If your skin becomes reddened/irritated stop using the CHG and inform your nurse when you arrive at Short Stay. Do not shave (including legs and underarms) for at least 48 hours prior to the first CHG shower.  You may shave your face/neck.  Please follow these  instructions carefully:  1.  Shower with CHG Soap the night before surgery and the  morning of surgery.  2.  If you choose to wash your hair, wash your hair first as usual with your normal  shampoo.  3.  After you shampoo, rinse your hair and body thoroughly to remove the shampoo.                             4.  Use CHG as you would any other liquid soap.  You can apply chg directly to the skin and wash.  Gently with a scrungie or clean washcloth.  5.  Apply the CHG Soap to your body ONLY FROM THE NECK DOWN.   Do   not use on face/ open                           Wound or open sores. Avoid contact with eyes, ears mouth and   genitals (private parts).                       Wash face,  Genitals (private parts) with your normal soap.             6.  Wash thoroughly, paying special attention to the area where your    surgery  will be performed.  7.  Thoroughly rinse your body with warm water from the neck down.  8.  DO NOT shower/wash with your normal soap after using and rinsing off the CHG Soap.                9.  Pat yourself dry with a clean towel.            10.  Wear clean pajamas.            11.  Place clean sheets on your bed the night of your first shower and do not  sleep with pets. Day of Surgery : Do not apply any lotions/deodorants the morning of surgery.  Please wear clean clothes to the hospital/surgery center.  FAILURE TO FOLLOW THESE INSTRUCTIONS MAY RESULT IN THE CANCELLATION OF YOUR SURGERY  PATIENT SIGNATURE_________________________________  NURSE SIGNATURE__________________________________  ________________________________________________________________________

## 2023-04-04 NOTE — Anesthesia Preprocedure Evaluation (Addendum)
Anesthesia Evaluation  Patient identified by MRN, date of birth, ID band Patient awake    Reviewed: Allergy & Precautions, NPO status , Patient's Chart, lab work & pertinent test results  Airway Mallampati: II  TM Distance: >3 FB Neck ROM: Full    Dental no notable dental hx. (+) Dental Advisory Given, Teeth Intact   Pulmonary sleep apnea    Pulmonary exam normal breath sounds clear to auscultation       Cardiovascular hypertension, Pt. on medications Normal cardiovascular exam+ Valvular Problems/Murmurs  Rhythm:Regular Rate:Normal     Neuro/Psych  Headaches PSYCHIATRIC DISORDERS Anxiety        GI/Hepatic Neg liver ROS,GERD  Medicated and Controlled,,  Endo/Other  negative endocrine ROS    Renal/GU negative Renal ROS     Musculoskeletal  (+) Arthritis ,    Abdominal  (+) + obese  Peds  Hematology  (+) Blood dyscrasia, anemia   Anesthesia Other Findings   Reproductive/Obstetrics                             Anesthesia Physical Anesthesia Plan  ASA: 2  Anesthesia Plan: General   Post-op Pain Management: Tylenol PO (pre-op)*   Induction: Intravenous  PONV Risk Score and Plan: 4 or greater and Ondansetron, Dexamethasone, Treatment may vary due to age or medical condition and Midazolam  Airway Management Planned: Oral ETT  Additional Equipment:   Intra-op Plan:   Post-operative Plan: Extubation in OR  Informed Consent: I have reviewed the patients History and Physical, chart, labs and discussed the procedure including the risks, benefits and alternatives for the proposed anesthesia with the patient or authorized representative who has indicated his/her understanding and acceptance.     Dental advisory given  Plan Discussed with: CRNA  Anesthesia Plan Comments: (See PAT note from 5/16 by Sherlie Ban PA-C)        Anesthesia Quick Evaluation

## 2023-04-04 NOTE — Progress Notes (Addendum)
Case: [id number] Date/Time: 04/08/23 1145   Procedure: LAPAROSCOPIC CHOLECYSTECTOMY WITH INDOCYANINE GREEN CHOLANGIOGRAPHY, POSSIBLE INTRAOPERATIVE CHOLANGIOGRAM   Anesthesia type: General   Pre-op diagnosis: SYMPTOMATIC CHOLELITHIASIS   Location: [facility] ROOM 01 / [facility]   Surgeons: Andria Meuse, MD       DISCUSSION: Deanna Long is a 52 year old female who is being evaluated prior to surgery listed above.  Patient with history of symptomatic cholelithiasis, small hiatal hernia, hypertension, obesity, prediabetes, OSA (no CPAP).  Patient follows with PCP and was last seen in clinic on 03/12/2023.  Hemoglobin A1c at that time was 5.7. All other issues stable.  Patient mentioned to PAT RN that she has a history of a heart murmur and had an echocardiogram in the past in [place], [place] which was reportedly normal.  I am unable to see records of this in care everywhere.  She has no documented heart murmur in any of her recent physical exams.  No history of heart disease. EKG was obtained in her PCP's office and is normal sinus rhythm.  Patient denied CP/SOB or any other functional limitations to PAT RN via telephone visit.  VS: Ht 5\' 2"  (1.575 m)   Wt 93.4 kg   LMP 12/20/2020   BMI 37.68 kg/m   PROVIDERS: Ralene Ok, MD   LABS: Labs reviewed: Acceptable for surgery. (Paper copy in chart) (all labs ordered are listed, but only abnormal results are displayed)  Labs Reviewed - No data to display   IMAGES: CXR 08/30/22 (care everywhere):  FINDINGS:   Cardiovascular: Cardiac silhouette and pulmonary vasculature are within normal limits.  Mediastinum: Within normal limits.  Lungs/pleura: No pulmonary consolidation or edema. No pleural effusion or pneumothorax.  Upper abdomen: Visualized portions are unremarkable.  Chest wall/osseous structures: No acute fracture. Mild multilevel thoracic spondylosis.    IMPRESSION:   There is no evidence of acute cardiac or  pulmonary abnormality.   EKG 03/12/23 (copy in chart):  NSR   CV: n/a  Past Medical History:  Diagnosis Date   Anemia    during pregnancy   Arthritis    Esophagitis    GERD (gastroesophageal reflux disease)    Headache    Heart murmur    Hypertension    Obesity    Sleep apnea    Superficial fungus infection of skin 08/23/2015    Past Surgical History:  Procedure Laterality Date   BREAST BIOPSY Left 12/24/2012   2 areas   CESAREAN SECTION     HERNIA REPAIR     TUBAL LIGATION      MEDICATIONS: No current facility-administered medications for this encounter.    acetaminophen (TYLENOL) 650 MG CR tablet   ALPRAZolam (XANAX) 0.25 MG tablet   atorvastatin (LIPITOR) 20 MG tablet   estradiol (ESTRACE) 1 MG tablet   lisinopril-hydrochlorothiazide (ZESTORETIC) 10-12.5 MG tablet   pantoprazole (PROTONIX) 20 MG tablet   progesterone (PROMETRIUM) 100 MG capsule   spironolactone (ALDACTONE) 25 MG tablet   sucralfate (CARAFATE) 1 g tablet   meloxicam (MOBIC) 15 MG tablet   [name], PA-C [facility] Surgical Short Stay/Anesthesiology [facility] Phone [phone] 04/04/2023 1:37 PM

## 2023-04-08 ENCOUNTER — Encounter (HOSPITAL_COMMUNITY): Payer: Self-pay | Admitting: Surgery

## 2023-04-08 ENCOUNTER — Other Ambulatory Visit: Payer: Self-pay

## 2023-04-08 ENCOUNTER — Ambulatory Visit (HOSPITAL_COMMUNITY): Payer: BC Managed Care – PPO | Admitting: Physician Assistant

## 2023-04-08 ENCOUNTER — Encounter (HOSPITAL_COMMUNITY)
Admission: RE | Disposition: A | Discharge status: Home / Self Care | Payer: Self-pay | Source: Home / Self Care | Attending: Surgery

## 2023-04-08 ENCOUNTER — Ambulatory Visit (HOSPITAL_COMMUNITY)
Admission: RE | Admit: 2023-04-08 | Discharge: 2023-04-08 | Disposition: A | Discharge status: Home / Self Care | Payer: BC Managed Care – PPO | Attending: Surgery | Admitting: Surgery

## 2023-04-08 DIAGNOSIS — E669 Obesity, unspecified: Secondary | ICD-10-CM | POA: Diagnosis not present

## 2023-04-08 DIAGNOSIS — I10 Essential (primary) hypertension: Secondary | ICD-10-CM

## 2023-04-08 DIAGNOSIS — K8020 Calculus of gallbladder without cholecystitis without obstruction: Secondary | ICD-10-CM | POA: Diagnosis present

## 2023-04-08 DIAGNOSIS — Z79899 Other long term (current) drug therapy: Secondary | ICD-10-CM | POA: Diagnosis not present

## 2023-04-08 DIAGNOSIS — G4733 Obstructive sleep apnea (adult) (pediatric): Secondary | ICD-10-CM | POA: Insufficient documentation

## 2023-04-08 DIAGNOSIS — M1990 Unspecified osteoarthritis, unspecified site: Secondary | ICD-10-CM | POA: Insufficient documentation

## 2023-04-08 DIAGNOSIS — R7303 Prediabetes: Secondary | ICD-10-CM | POA: Insufficient documentation

## 2023-04-08 DIAGNOSIS — Z01818 Encounter for other preprocedural examination: Secondary | ICD-10-CM

## 2023-04-08 DIAGNOSIS — Z6838 Body mass index (BMI) 38.0-38.9, adult: Secondary | ICD-10-CM | POA: Insufficient documentation

## 2023-04-08 DIAGNOSIS — K219 Gastro-esophageal reflux disease without esophagitis: Secondary | ICD-10-CM | POA: Diagnosis not present

## 2023-04-08 HISTORY — DX: Sleep apnea, unspecified: G47.30

## 2023-04-08 HISTORY — DX: Cardiac murmur, unspecified: R01.1

## 2023-04-08 HISTORY — DX: Anemia, unspecified: D64.9

## 2023-04-08 HISTORY — DX: Headache, unspecified: R51.9

## 2023-04-08 HISTORY — DX: Essential (primary) hypertension: I10

## 2023-04-08 HISTORY — PX: CHOLECYSTECTOMY: SHX55

## 2023-04-08 HISTORY — DX: Esophagitis, unspecified without bleeding: K20.90

## 2023-04-08 LAB — CBC WITH DIFFERENTIAL/PLATELET
Abs Immature Granulocytes: 0.02 10*3/uL (ref 0.00–0.07)
Basophils Absolute: 0 10*3/uL (ref 0.0–0.1)
Basophils Relative: 0 %
Eosinophils Absolute: 0 10*3/uL (ref 0.0–0.5)
Eosinophils Relative: 1 %
HCT: 35.1 % — ABNORMAL LOW (ref 36.0–46.0)
Hemoglobin: 11.2 g/dL — ABNORMAL LOW (ref 12.0–15.0)
Immature Granulocytes: 0 %
Lymphocytes Relative: 14 %
Lymphs Abs: 0.9 10*3/uL (ref 0.7–4.0)
MCH: 26.5 pg (ref 26.0–34.0)
MCHC: 31.9 g/dL (ref 30.0–36.0)
MCV: 83.2 fL (ref 80.0–100.0)
Monocytes Absolute: 0.2 10*3/uL (ref 0.1–1.0)
Monocytes Relative: 2 %
Neutro Abs: 5.7 10*3/uL (ref 1.7–7.7)
Neutrophils Relative %: 83 %
Platelets: 291 10*3/uL (ref 150–400)
RBC: 4.22 MIL/uL (ref 3.87–5.11)
RDW: 15.3 % (ref 11.5–15.5)
WBC: 6.8 10*3/uL (ref 4.0–10.5)
nRBC: 0 % (ref 0.0–0.2)

## 2023-04-08 LAB — COMPREHENSIVE METABOLIC PANEL
ALT: 48 U/L — ABNORMAL HIGH (ref 0–44)
AST: 40 U/L (ref 15–41)
Albumin: 3.5 g/dL (ref 3.5–5.0)
Alkaline Phosphatase: 140 U/L — ABNORMAL HIGH (ref 38–126)
Anion gap: 8 (ref 5–15)
BUN: 14 mg/dL (ref 6–20)
CO2: 25 mmol/L (ref 22–32)
Calcium: 9.2 mg/dL (ref 8.9–10.3)
Chloride: 105 mmol/L (ref 98–111)
Creatinine, Ser: 0.62 mg/dL (ref 0.44–1.00)
GFR, Estimated: >60 mL/min (ref >60)
Glucose, Bld: 106 mg/dL — ABNORMAL HIGH (ref 70–99)
Potassium: 3.5 mmol/L (ref 3.5–5.1)
Sodium: 138 mmol/L (ref 135–145)
Total Bilirubin: 0.5 mg/dL (ref 0.3–1.2)
Total Protein: 6.8 g/dL (ref 6.5–8.1)

## 2023-04-08 SURGERY — LAPAROSCOPIC CHOLECYSTECTOMY
Anesthesia: General

## 2023-04-08 MED ORDER — PROPOFOL 500 MG/50ML IV EMUL
INTRAVENOUS | Status: AC
  Filled 2023-04-08: qty 50

## 2023-04-08 MED ORDER — LACTATED RINGERS IV SOLN: INTRAVENOUS | Status: DC

## 2023-04-08 MED ORDER — OXYCODONE HCL 5 MG PO TABS
ORAL_TABLET | ORAL | Status: AC
  Filled 2023-04-08: qty 1

## 2023-04-08 MED ORDER — BUPIVACAINE HCL (PF) 0.25 % IJ SOLN
INTRAMUSCULAR | Status: AC
  Filled 2023-04-08: qty 30

## 2023-04-08 MED ORDER — PROPOFOL 10 MG/ML IV BOLUS
INTRAVENOUS | Status: AC
  Filled 2023-04-08: qty 20

## 2023-04-08 MED ORDER — INDOCYANINE GREEN 25 MG IV SOLR
2.5000 mg | Freq: Once | INTRAVENOUS | Status: AC
  Administered 2023-04-08: 2.5 mg via INTRAVENOUS

## 2023-04-08 MED ORDER — OXYCODONE HCL 5 MG PO TABS
5.0000 mg | ORAL_TABLET | Freq: Once | ORAL | Status: AC | PRN
  Administered 2023-04-08: 5 mg via ORAL

## 2023-04-08 MED ORDER — CHLORHEXIDINE GLUCONATE CLOTH 2 % EX PADS: 6.0000 | MEDICATED_PAD | Freq: Once | CUTANEOUS | Status: DC

## 2023-04-08 MED ORDER — CEFAZOLIN SODIUM-DEXTROSE 2-4 GM/100ML-% IV SOLN
2.0000 g | INTRAVENOUS | Status: AC
  Administered 2023-04-08: 2 g via INTRAVENOUS
  Filled 2023-04-08: qty 100

## 2023-04-08 MED ORDER — HYDROMORPHONE HCL 1 MG/ML IJ SOLN
0.2500 mg | INTRAMUSCULAR | Status: DC | PRN
  Administered 2023-04-08 (×3): 0.5 mg via INTRAVENOUS

## 2023-04-08 MED ORDER — PROPOFOL 10 MG/ML IV BOLUS
INTRAVENOUS | Status: DC | PRN
  Administered 2023-04-08: 125 ug/kg/min via INTRAVENOUS
  Administered 2023-04-08: 150 mg via INTRAVENOUS

## 2023-04-08 MED ORDER — ONDANSETRON HCL 4 MG/2ML IJ SOLN
INTRAMUSCULAR | Status: AC
  Filled 2023-04-08: qty 2

## 2023-04-08 MED ORDER — LIDOCAINE HCL 2 % IJ SOLN
INTRAMUSCULAR | Status: AC
  Filled 2023-04-08: qty 20

## 2023-04-08 MED ORDER — MIDAZOLAM HCL 5 MG/5ML IJ SOLN
INTRAMUSCULAR | Status: DC | PRN
  Administered 2023-04-08: 2 mg via INTRAVENOUS

## 2023-04-08 MED ORDER — TRAMADOL HCL 50 MG PO TABS: 50.0000 mg | ORAL_TABLET | Freq: Four times a day (QID) | ORAL | 0 refills | Status: AC | PRN

## 2023-04-08 MED ORDER — ORAL CARE MOUTH RINSE: 15.0000 mL | Freq: Once | OROMUCOSAL | Status: AC

## 2023-04-08 MED ORDER — PHENYLEPHRINE HCL-NACL 20-0.9 MG/250ML-% IV SOLN
INTRAVENOUS | Status: DC | PRN
  Administered 2023-04-08: 35 ug/min via INTRAVENOUS

## 2023-04-08 MED ORDER — LIDOCAINE HCL (PF) 2 % IJ SOLN
INTRAMUSCULAR | Status: AC
  Filled 2023-04-08: qty 5

## 2023-04-08 MED ORDER — ACETAMINOPHEN 500 MG PO TABS
1000.0000 mg | ORAL_TABLET | ORAL | Status: AC
  Administered 2023-04-08: 1000 mg via ORAL
  Filled 2023-04-08: qty 2

## 2023-04-08 MED ORDER — DEXMEDETOMIDINE HCL IN NACL 80 MCG/20ML IV SOLN
INTRAVENOUS | Status: DC | PRN
  Administered 2023-04-08: 8 ug via INTRAVENOUS
  Administered 2023-04-08: 4 ug via INTRAVENOUS

## 2023-04-08 MED ORDER — MIDAZOLAM HCL 2 MG/2ML IJ SOLN
INTRAMUSCULAR | Status: AC
  Filled 2023-04-08: qty 2

## 2023-04-08 MED ORDER — 0.9 % SODIUM CHLORIDE (POUR BTL) OPTIME
TOPICAL | Status: DC | PRN
  Administered 2023-04-08: 1000 mL

## 2023-04-08 MED ORDER — AMISULPRIDE (ANTIEMETIC) 5 MG/2ML IV SOLN: 10.0000 mg | Freq: Once | INTRAVENOUS | Status: DC | PRN

## 2023-04-08 MED ORDER — HYDROMORPHONE HCL 1 MG/ML IJ SOLN
INTRAMUSCULAR | Status: AC
  Filled 2023-04-08: qty 1

## 2023-04-08 MED ORDER — BUPIVACAINE LIPOSOME 1.3 % IJ SUSP: 20.0000 mL | Freq: Once | INTRAMUSCULAR | Status: DC

## 2023-04-08 MED ORDER — DIPHENHYDRAMINE HCL 25 MG PO CAPS
25.0000 mg | ORAL_CAPSULE | Freq: Once | ORAL | Status: AC
  Administered 2023-04-08: 25 mg via ORAL

## 2023-04-08 MED ORDER — DEXAMETHASONE SODIUM PHOSPHATE 10 MG/ML IJ SOLN
INTRAMUSCULAR | Status: AC
  Filled 2023-04-08: qty 1

## 2023-04-08 MED ORDER — BUPIVACAINE HCL (PF) 0.25 % IJ SOLN
INTRAMUSCULAR | Status: DC | PRN
  Administered 2023-04-08: 18 mL

## 2023-04-08 MED ORDER — ROCURONIUM BROMIDE 10 MG/ML (PF) SYRINGE
PREFILLED_SYRINGE | INTRAVENOUS | Status: DC | PRN
  Administered 2023-04-08: 20 mg via INTRAVENOUS
  Administered 2023-04-08: 60 mg via INTRAVENOUS

## 2023-04-08 MED ORDER — SUGAMMADEX SODIUM 200 MG/2ML IV SOLN
INTRAVENOUS | Status: DC | PRN
  Administered 2023-04-08: 200 mg via INTRAVENOUS

## 2023-04-08 MED ORDER — PROPOFOL 1000 MG/100ML IV EMUL
INTRAVENOUS | Status: AC
  Filled 2023-04-08: qty 100

## 2023-04-08 MED ORDER — OXYCODONE HCL 5 MG/5ML PO SOLN: 5.0000 mg | Freq: Once | ORAL | Status: AC | PRN

## 2023-04-08 MED ORDER — MEPERIDINE HCL 50 MG/ML IJ SOLN: 6.2500 mg | INTRAMUSCULAR | Status: DC | PRN

## 2023-04-08 MED ORDER — LIDOCAINE 2% (20 MG/ML) 5 ML SYRINGE
INTRAMUSCULAR | Status: DC | PRN
  Administered 2023-04-08: 1.5 mg/kg/h via INTRAVENOUS
  Administered 2023-04-08: 100 mg via INTRAVENOUS

## 2023-04-08 MED ORDER — CHLORHEXIDINE GLUCONATE 0.12 % MT SOLN
15.0000 mL | Freq: Once | OROMUCOSAL | Status: AC
  Administered 2023-04-08: 15 mL via OROMUCOSAL

## 2023-04-08 MED ORDER — FENTANYL CITRATE (PF) 100 MCG/2ML IJ SOLN
INTRAMUSCULAR | Status: DC | PRN
  Administered 2023-04-08: 50 ug via INTRAVENOUS
  Administered 2023-04-08: 100 ug via INTRAVENOUS

## 2023-04-08 MED ORDER — PROMETHAZINE HCL 25 MG/ML IJ SOLN: 6.2500 mg | INTRAMUSCULAR | Status: DC | PRN

## 2023-04-08 MED ORDER — FENTANYL CITRATE (PF) 250 MCG/5ML IJ SOLN
INTRAMUSCULAR | Status: AC
  Filled 2023-04-08: qty 5

## 2023-04-08 MED ORDER — LACTATED RINGERS IR SOLN
Status: DC | PRN
  Administered 2023-04-08: 1000 mL

## 2023-04-08 MED ORDER — DIPHENHYDRAMINE HCL 25 MG PO CAPS
ORAL_CAPSULE | ORAL | Status: AC
  Filled 2023-04-08: qty 1

## 2023-04-08 MED ORDER — ROCURONIUM BROMIDE 10 MG/ML (PF) SYRINGE
PREFILLED_SYRINGE | INTRAVENOUS | Status: AC
  Filled 2023-04-08: qty 10

## 2023-04-08 MED ORDER — DEXAMETHASONE SODIUM PHOSPHATE 10 MG/ML IJ SOLN
INTRAMUSCULAR | Status: DC | PRN
  Administered 2023-04-08: 10 mg via INTRAVENOUS

## 2023-04-08 MED ORDER — ONDANSETRON HCL 4 MG/2ML IJ SOLN
INTRAMUSCULAR | Status: DC | PRN
  Administered 2023-04-08: 4 mg via INTRAVENOUS

## 2023-04-08 SURGICAL SUPPLY — 48 items
ADH SKN CLS APL DERMABOND .7 (GAUZE/BANDAGES/DRESSINGS) ×1
APL PRP STRL LF DISP 70% ISPRP (MISCELLANEOUS) ×1
APL SRG 38 LTWT LNG FL B (MISCELLANEOUS)
APPLICATOR ARISTA FLEXITIP XL (MISCELLANEOUS) IMPLANT
APPLIER CLIP 5 13 M/L LIGAMAX5 (MISCELLANEOUS) ×1
APPLIER CLIP ROT 10 11.4 M/L (STAPLE)
APR CLP MED LRG 11.4X10 (STAPLE)
APR CLP MED LRG 5 ANG JAW (MISCELLANEOUS) ×1
BAG COUNTER SPONGE SURGICOUNT (BAG) IMPLANT
BAG SPNG CNTER NS LX DISP (BAG)
CABLE HIGH FREQUENCY MONO STRZ (ELECTRODE) ×1 IMPLANT
CHLORAPREP W/TINT 26 (MISCELLANEOUS) ×1 IMPLANT
CLIP APPLIE 5 13 M/L LIGAMAX5 (MISCELLANEOUS) ×1 IMPLANT
CLIP APPLIE ROT 10 11.4 M/L (STAPLE) IMPLANT
COVER MAYO STAND XLG (MISCELLANEOUS) ×1 IMPLANT
COVER SURGICAL LIGHT HANDLE (MISCELLANEOUS) ×1 IMPLANT
DERMABOND ADVANCED .7 DNX12 (GAUZE/BANDAGES/DRESSINGS) ×1 IMPLANT
DISSECTOR BLUNT TIP ENDO 5MM (MISCELLANEOUS) IMPLANT
DRAPE C-ARM 42X120 X-RAY (DRAPES) IMPLANT
ELECT PENCIL ROCKER SW 15FT (MISCELLANEOUS) ×1 IMPLANT
ELECT REM PT RETURN 15FT ADLT (MISCELLANEOUS) ×1 IMPLANT
GLOVE BIO SURGEON STRL SZ7.5 (GLOVE) ×1 IMPLANT
GLOVE INDICATOR 8.0 STRL GRN (GLOVE) ×1 IMPLANT
GOWN STRL REUS W/ TWL XL LVL3 (GOWN DISPOSABLE) ×2 IMPLANT
GOWN STRL REUS W/TWL XL LVL3 (GOWN DISPOSABLE) ×2
GRASPER SUT TROCAR 14GX15 (MISCELLANEOUS) IMPLANT
HEMOSTAT ARISTA ABSORB 3G PWDR (HEMOSTASIS) IMPLANT
HEMOSTAT SNOW SURGICEL 2X4 (HEMOSTASIS) IMPLANT
IRRIG SUCT STRYKERFLOW 2 WTIP (MISCELLANEOUS) ×1
IRRIGATION SUCT STRKRFLW 2 WTP (MISCELLANEOUS) ×1 IMPLANT
KIT BASIN OR (CUSTOM PROCEDURE TRAY) ×1 IMPLANT
KIT TURNOVER KIT A (KITS) IMPLANT
NDL INSUFFLATION 14GA 120MM (NEEDLE) IMPLANT
NEEDLE INSUFFLATION 14GA 120MM (NEEDLE) IMPLANT
SCISSORS LAP 5X35 DISP (ENDOMECHANICALS) ×1 IMPLANT
SET CHOLANGIOGRAPH MIX (MISCELLANEOUS) IMPLANT
SET TUBE SMOKE EVAC HIGH FLOW (TUBING) ×1 IMPLANT
SLEEVE ADV FIXATION 5X100MM (TROCAR) ×2 IMPLANT
SPIKE FLUID TRANSFER (MISCELLANEOUS) ×1 IMPLANT
SUT MNCRL AB 4-0 PS2 18 (SUTURE) ×1 IMPLANT
SYR 20ML ECCENTRIC (SYRINGE) ×1 IMPLANT
SYS BAG RETRIEVAL 10MM (BASKET) ×1
SYSTEM BAG RETRIEVAL 10MM (BASKET) ×1 IMPLANT
TOWEL OR 17X26 10 PK STRL BLUE (TOWEL DISPOSABLE) ×1 IMPLANT
TOWEL OR NON WOVEN STRL DISP B (DISPOSABLE) IMPLANT
TRAY LAPAROSCOPIC (CUSTOM PROCEDURE TRAY) ×1 IMPLANT
TROCAR ADV FIXATION 5X100MM (TROCAR) ×1 IMPLANT
TROCAR BALLN 12MMX100 BLUNT (TROCAR) ×1 IMPLANT

## 2023-04-08 NOTE — Progress Notes (Signed)
Page to Dr. Cliffton Asters re: orders for pre-op ICG. Awaiting CB.

## 2023-04-08 NOTE — Op Note (Addendum)
04/08/2023 2:53 PM  PATIENT: Deanna Long  52 y.o. female  Patient Care Team: Ralene Ok, MD as PCP - General (Internal Medicine)  PRE-OPERATIVE DIAGNOSIS: Symptomatic cholelithiasis  POST-OPERATIVE DIAGNOSIS: Same  PROCEDURE: Laparoscopic cholecystectomy with indocyanine green cholagiography  SURGEON: Marin Olp, MD  ASSISTANT: Dr. Bernarda Caffey, MD PGY-6  ANESTHESIA: General endotracheal  EBL: 10 mL  DRAINS: None  SPECIMEN: Gallbladder  COUNTS: Sponge, needle and instrument counts were reported correct x2 at the conclusion of the operation  DISPOSITION: PACU in satisfactory condition  COMPLICATIONS: None  FINDINGS: Thin adhesions involving the infundibular gallbladder and pylorus of the stomach that were able to be lysed sharply.  Somewhat fibrotic tissues around the infundibulum of the gallbladder, however, a good critical view of safety achieved prior to clipping or dividing any structures.  DESCRIPTION:   The patient was identified & brought into the operating room. She was then positioned supine on the OR table. SCDs were in place and active during the entire case. She then underwent general endotracheal anesthesia. Pressure points were padded. Hair on the abdomen was clipped by the OR team. The abdomen was prepped and draped in the standard sterile fashion. Antibiotics were administered. A surgical timeout was performed and confirmed our plan.   A periumbilical incision was made. The umbilical stalk was grasped and retracted outwardly. The supraumbilical fascia was identified and incised. The peritoneal cavity was gently entered bluntly. A purse-string 0 Vicryl suture was placed. The Hasson cannula was inserted into the peritoneal cavity and insufflation with CO2 commenced to . A laparoscope was inserted into the peritoneal cavity and inspection confirmed no evidence of trocar site complications. The patient was then positioned in reverse Trendelenburg with  slight left side down. 3 additional 5mm trocars were placed along the right subcostal line - one 5mm port in mid subcostal region, another 5mm port in the right flank near the anterior axillary line, and a third 5mm port in the left subxiphoid region obliquely near the falciform ligament.  The liver and gallbladder were inspected.  Liver has some fatty type changes to it with somewhat rounded liver edges consistent with mildly fatty liver. The gallbladder fundus was grasped and elevated cephalad. An additional grasper was then placed on the infundibulum of the gallbladder and the infundibulum was retracted laterally.  There are adhesions between the infundibulum and the pylorus the stomach which were able to lysed sharply without difficulty.  Staying high on the gallbladder, the peritoneum on both sides of the gallbladder was opened with hook cautery. Gentle blunt dissection was then employed with a Art gallery manager working down into [employer]. The cystic duct was identified and carefully circumferentially dissected. The cystic artery was also identified and carefully circumferentially dissected. The space between the cystic artery and hepatocystic plate was developed such that a good view of the liver could be seen through a window medial to the cystic artery. The triangle of Calot had been cleared of all fibrofatty tissue. At this point, a critical view of safety was achieved and the only structures visualized was the skeletonized cystic duct laterally, the skeletonized cystic artery and the liver through the window medial to the artery.  A diminutive posterior cystic artery is noted.  Under near-infrared light, ICG cholangiography demonstrates avid uptake of the tracer within the gallbladder with opacification of the cystic duct as well as ductal structures near the porta.  There is also tracer seen through the wall of the duodenum consistent with a patent biliary system.  The only tubular structure  with tracer within it is the cystic duct and the other tubular structures consistent with an anterior and posterior division of her cystic artery are seen.  The cystic duct/distal pole infundibulum is somewhat funneled and therefore we opted to control this with gold Hem-o-lok clips x 2.  The anterior and posterior divisions of the cystic artery were also controlled individually with 10 mm clips.  2 on the patient's side, 1 on the specimen side. The cystic duct and artery were then divided. The gallbladder was then freed from its remaining attachments to the liver using electrocautery and placed into an endocatch bag.  There was a small amount of bile spillage during removal from the hepatocystic plate but no spillage of stones.    The RUQ was gently irrigated with sterile saline. Hemostasis was then verified. The clips were in good position; the gallbladder fossa was dry. The rest of the abdomen was inspected no injury nor bleeding elsewhere was identified.  The endocatch bag containing the gallbladder was then removed from the umbilical port site and passed off as specimen. The RUQ ports were removed under direct visualization and noted to be hemostatic. The umbilical fascia was then closed using the 0 Vicryl purse-string suture. The fascia was palpated and noted to be completely closed. The skin of all incision sites was approximated with 4-0 monocryl subcuticular suture and dermabond applied. She was then awakened from anesthesia, extubated, and transferred to a stretcher for transport to PACU in satisfactory condition.

## 2023-04-08 NOTE — Discharge Instructions (Addendum)
POST OP INSTRUCTIONS  DIET: As tolerated. Follow a light bland diet the first 24 hours after arrival home, such as soup, liquids, crackers, etc.  Be sure to include lots of fluids daily.  Avoid fast food or heavy meals as your are more likely to get nauseated.  Eat a low fat the next few days after surgery.  Take your usually prescribed home medications unless otherwise directed.  PAIN CONTROL: Pain is best controlled by a usual combination of three different methods TOGETHER: Ice/Heat Over the counter pain medication Prescription pain medication Most patients will experience some swelling and bruising around the surgical site.  Ice packs or heating pads (30-60 minutes up to 6 times a day) will help. Some people prefer to use ice alone, heat alone, alternating between ice & heat.  Experiment to what works for you.  Swelling and bruising can take several weeks to resolve.   It is helpful to take an over-the-counter pain medication regularly for the first few weeks: Ibuprofen (Motrin/Advil) - 200mg tabs - take 3 tabs (600mg) every 6 hours as needed for pain Acetaminophen (Tylenol) - you may take 650mg every 6 hours as needed. You can take this with motrin as they act differently on the body. If you are taking a narcotic pain medication that has acetaminophen in it, do not take over the counter tylenol at the same time.  Iii. NOTE: You may take both of these medications together - most patients  find it most helpful when alternating between the two (i.e. Ibuprofen at 6am,  tylenol at 9am, ibuprofen at 12pm ...) A  prescription for pain medication should be given to you upon discharge.  Take your pain medication as prescribed if your pain is not adequatly controlled with the over-the-counter pain reliefs mentioned above.  Avoid getting constipated.  Between the surgery and the pain medications, it is common to experience some constipation.  Increasing fluid intake and taking a fiber supplement (such as  Metamucil, Citrucel, FiberCon, MiraLax, etc) 1-2 times a day regularly will usually help prevent this problem from occurring.  A mild laxative (prune juice, Milk of Magnesia, MiraLax, etc) should be taken according to package directions if there are no bowel movements after 48 hours.    Dressing: Your incision is covered are Dermabond which is like sterile superglue for the skin. This will come off on it's own in a couple weeks. It is waterproof and you may bathe normally starting the day after your surgery in a shower. Avoid baths/pools/lakes/oceans until your wounds have fully healed.  ACTIVITIES as tolerated:   Avoid heavy lifting (>10lbs or 1 gallon of milk) for the next 6 weeks. You may resume regular (light) daily activities beginning the next day--such as daily self-care, walking, climbing stairs--gradually increasing activities as tolerated.  If you can walk 30 minutes without difficulty, it is safe to try more intense activity such as jogging, treadmill, bicycling, low-impact aerobics.  DO NOT PUSH THROUGH PAIN.  Let pain be your guide: If it hurts to do something, don't do it. You may drive when you are no longer taking prescription pain medication, you can comfortably wear a seatbelt, and you can safely maneuver your car and apply brakes.   FOLLOW UP in our office Please call [facility] at [phone] to set up an appointment to see your surgeon in the office for a follow-up appointment approximately 2 weeks after your surgery. Make sure that you call for this appointment the day you arrive home to   insure a convenient appointment time.  9. If you have disability or family leave forms that need to be completed, you may have them completed by your primary care physician's office; for return to work instructions, please ask our office staff and they will be happy to assist you in obtaining this documentation   When to call us [phone]: Poor pain control Reactions / problems with new  medications (rash/itching, etc)  Fever over 101.5 F (38.5 C) Inability to urinate Nausea/vomiting Worsening swelling or bruising Continued bleeding from incision. Increased pain, redness, or drainage from the incision  The clinic staff is available to answer your questions during regular business hours (8:30am-5pm).  Please don't hesitate to call and ask to speak to one of our nurses for clinical concerns.   A surgeon from [facility] is always on call at the hospitals   If you have a medical emergency, go to the nearest emergency room or call 911.  [facility] [address], [address], [place], [place]  [address] MAIN: [phone] FAX: [phone] [email or web address]  

## 2023-04-08 NOTE — Anesthesia Postprocedure Evaluation (Signed)
Anesthesia Post Note  Patient: Deanna Long  Procedure(s) Performed: LAPAROSCOPIC CHOLECYSTECTOMY WITH INDOCYANINE GREEN CHOLANGIOGRAPHY     Patient location during evaluation: PACU Anesthesia Type: General Level of consciousness: sedated and patient cooperative Pain management: pain level controlled Vital Signs Assessment: post-procedure vital signs reviewed and stable Respiratory status: spontaneous breathing Cardiovascular status: stable Anesthetic complications: no   No notable events documented.  Last Vitals:  Vitals:   04/08/23 1600 04/08/23 1618  BP: 119/83 122/77  Pulse: (!) 53 60  Resp: 12   Temp: (!) 36.4 C   SpO2: 98% 100%    Last Pain:  Vitals:   04/08/23 1618  TempSrc:   PainSc: 0-No pain                 Lewie Loron

## 2023-04-08 NOTE — Anesthesia Procedure Notes (Signed)
Procedure Name: Intubation Date/Time: 04/08/2023 1:27 PM  Performed by: Orest Dikes, CRNAPre-anesthesia Checklist: Patient identified, Emergency Drugs available, Suction available and Patient being monitored Patient Re-evaluated:Patient Re-evaluated prior to induction Oxygen Delivery Method: Circle system utilized Preoxygenation: Pre-oxygenation with 100% oxygen Induction Type: IV induction Ventilation: Mask ventilation without difficulty Laryngoscope Size: Mac and 4 Grade View: Grade I Tube type: Oral Tube size: 7.0 mm Number of attempts: 1 Airway Equipment and Method: Stylet and Oral airway Placement Confirmation: ETT inserted through vocal cords under direct vision, positive ETCO2 and breath sounds checked- equal and bilateral Secured at: 21 cm Tube secured with: Tape Dental Injury: Teeth and Oropharynx as per pre-operative assessment and Injury to lip  Comments: Intubation by EMT. Small cut noted on upper right lip.

## 2023-04-08 NOTE — H&P (Signed)
CC: Here today for surgery  HPI: Deanna Long is an 52 y.o. female with history of HTN, HLD, obesity, idiopathic constipation, esophagitis, whom was seen in the office previously as a referral by Dr. Loreta Ave for evaluation of symptomatic cholelithiasis.   Abdominal US for abdominal bloating and discomfort, elevated liver function tests through [facility] 01/26/23 -  1. Small stone or polyp within the gallbladder 2. Small right renal stone CBD was reported to have a normal diameter.  EGD 02/27/23 (Dr. Loreta Ave) - Esophagitis with some bleeding at the GE junction - Small hiatal hernia, otherwise normal-appearing stomach - Normal examined duodenum.  We do not have physical copies of her abnormal LFTs that were referenced in her ultrasound but reviewing Dr. Kenna Gilbert note, she noted that in February, 2024, she had nausea, vomiting, abdominal pain and bloating. She had reported lab work 01/08/2023: - Alk phos 165 - ALT 93 - ALT 88.  Total bilirubin & lipase was not mentioned.  She had additional lab work recently with her PCP through [facility] and she brings copies of these results today, dated 03/12/23: - Alk phos: 251 - AST: 34 - ALT: 69 - Total bilirubin: 0.4  Reports beginning somewhere around February, 2024 she began having severe crampy mid epigastric pain that was intermittent. She would have attacks of this kind of pain that would last for a few hours and subside. She has experienced a few of these attacks over the last 3 months. No fever or chills. Some nausea. Denies any black/tarry stool, pale stool, yellowing of her skin/eyes.   She has been taking Protonix as well as at least twice a day Carafate. Some of her reflux type symptoms have improved with this modality but the attacks of the MEG pain have persisted.  She does note that she lost upwards of 40 pounds towards the end of last year with Ozempic. She has regained at least 20 pounds since coming off it. She questions whether the  Ozempic/weight loss could have led to the gallstones.   She otherwise denies any changes in health or health history since we met in the office. No new medications/allergies. She states she is ready for surgery today.  PMH: HTN, HLD, obesity, idiopathic constipation, esophagitis  PSH: C-Sx via Pfanny Tubal + umb hernia repair same time 1998, no mesh  FHx: Maternal aunt had Crohn's disease. Mother had breast cancer. Denies any other known family history of colorectal, breast, endometrial or ovarian cancer  Social Hx: Denies use of tobacco/EtOH/illicit drug. 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 She is here today by herself.   Past Medical History:  Diagnosis Date   Anemia    during pregnancy   Arthritis    Esophagitis    GERD (gastroesophageal reflux disease)    Headache    Heart murmur    Hypertension    Obesity    Sleep apnea    Superficial fungus infection of skin 08/23/2015    Past Surgical History:  Procedure Laterality Date   BREAST BIOPSY Left 12/24/2012   2 areas   CESAREAN SECTION     HERNIA REPAIR     TUBAL LIGATION      Family History  Problem Relation Age of Onset   Breast cancer Mother    Hypertension Mother    Cancer Mother        breast,kidney   Fibromyalgia Mother    Migraines Mother    Arthritis Father  Social:  reports that she has never smoked. She has never used smokeless tobacco. She reports that she does not drink alcohol and does not use drugs.  Allergies: No Known Allergies  Medications: I have reviewed the patient's current medications.  No results found for this or any previous visit (from the past 48 hour(s)).  No results found.   PE Height 5\' 2"  (1.575 m), weight 93.4 kg, last menstrual period 12/20/2020. Constitutional: NAD; conversant Eyes: Moist conjunctiva; anicteric Lungs: Normal respiratory effort CV: RRR GI: Abd soft, NT/ND; no palpable hepatosplenomegaly Psychiatric: Appropriate  affect  No results found for this or any previous visit (from the past 48 hour(s)).  No results found.  A/P: Deanna Long is an 52 y.o. female with hx of HTN, HLD, obesity, idiopathic constipation, esophagitis, here for evaluation of symptomatic cholelithiasis  -The anatomy and physiology of the hepatobiliary system was discussed with her with associated pictures using our cholecystectomy handbook. We also spent time reviewing the pathophysiology of gallbladder disease.  -The options for treatment were discussed including ongoing observation which carries some risk of subsequent gallbladder complications (infection, pancreatitis, choledocholithiasis, etc). We reviewed surgery as the most definitive treatment option moving forward - laparoscopic cholecystectomy with indocyanine green cholangiography, possible intraoperative cholangiogram  -The planned procedures, material risks (including, but not limited to, pain, bleeding, infection, scarring, need for blood transfusion, damage to surrounding structures- blood vessels/nerves/viscus/organs, damage to bile duct, bile leak, conversion to a 'subtotal' cholecystectomy and general expectations therein, post cholecystectomy diarrhea, need for additional procedures, hernia, worsening of pre-existing medical conditions, pancreatitis, pneumonia, heart attack, stroke, death) benefits and alternatives to surgery were discussed at length. I noted a good probability that the procedure would help improve her symptoms. The patient's questions were answered to her satisfaction, she voiced understanding and they elected to proceed with surgery. Additionally, we discussed typical postoperative expectations and the recovery process. We also spent time discussing situations where symptoms may persist following surgery when other potential etiologies could actually be the cause.    Marin Olp, MD [facility]

## 2023-04-08 NOTE — Transfer of Care (Signed)
Immediate Anesthesia Transfer of Care Note  Patient: Deanna Long  Procedure(s) Performed: LAPAROSCOPIC CHOLECYSTECTOMY WITH INDOCYANINE GREEN CHOLANGIOGRAPHY  Patient Location: PACU  Anesthesia Type:General  Level of Consciousness: awake, alert , oriented, and patient cooperative  Airway & Oxygen Therapy: Patient Spontanous Breathing and Patient connected to face mask oxygen  Post-op Assessment: Report given to RN and Post -op Vital signs reviewed and stable  Post vital signs: Reviewed and stable  Last Vitals:  Vitals Value Taken Time  BP    Temp    Pulse 59 04/08/23 1508  Resp 21 04/08/23 1508  SpO2 99 % 04/08/23 1508  Vitals shown include unvalidated device data.  Last Pain:  Vitals:   04/08/23 1046  TempSrc:   PainSc: 5          Complications: No notable events documented.

## 2023-04-09 ENCOUNTER — Encounter (HOSPITAL_COMMUNITY): Payer: Self-pay | Admitting: Surgery

## 2023-04-09 LAB — SURGICAL PATHOLOGY

## 2023-06-14 ENCOUNTER — Other Ambulatory Visit: Payer: Self-pay | Admitting: Oncology

## 2023-06-14 DIAGNOSIS — Z006 Encounter for examination for normal comparison and control in clinical research program: Secondary | ICD-10-CM

## 2023-07-04 IMAGING — MG MM DIGITAL SCREENING BILAT W/ TOMO AND CAD
8 series · 8 of 24 positions shown · non-contrast
Comparison: Previous exam(s).

CLINICAL DATA: Screening.

EXAM:
DIGITAL SCREENING BILATERAL MAMMOGRAM WITH TOMOSYNTHESIS AND CAD
TECHNIQUE: Bilateral screening digital craniocaudal and mediolateral oblique
mammograms were obtained. Bilateral screening digital breast
tomosynthesis was performed. The images were evaluated with
computer-aided detection.

[R CC synth-2D]
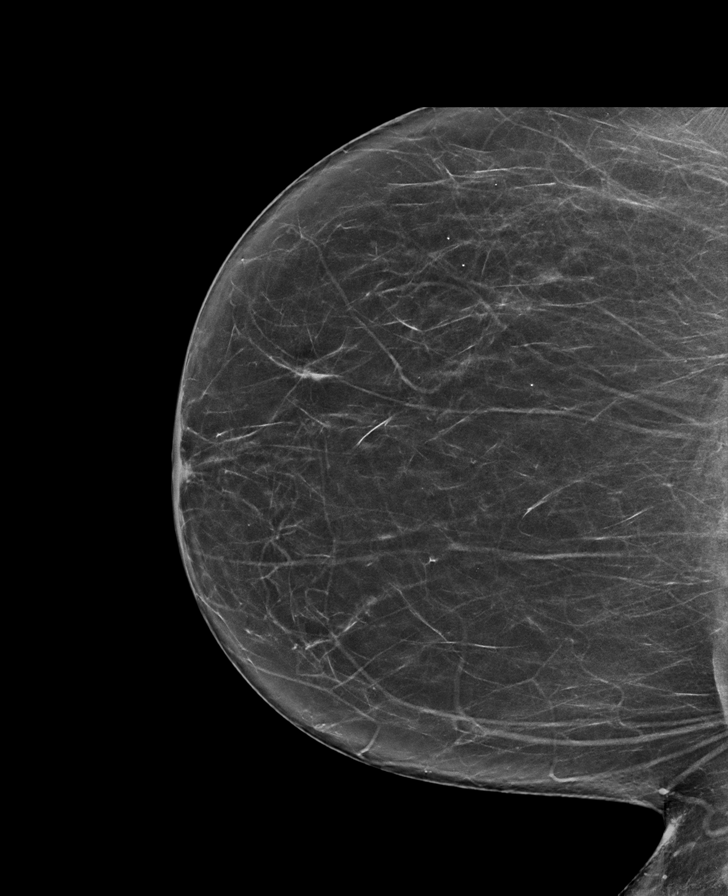

[L MLO synth-2D]
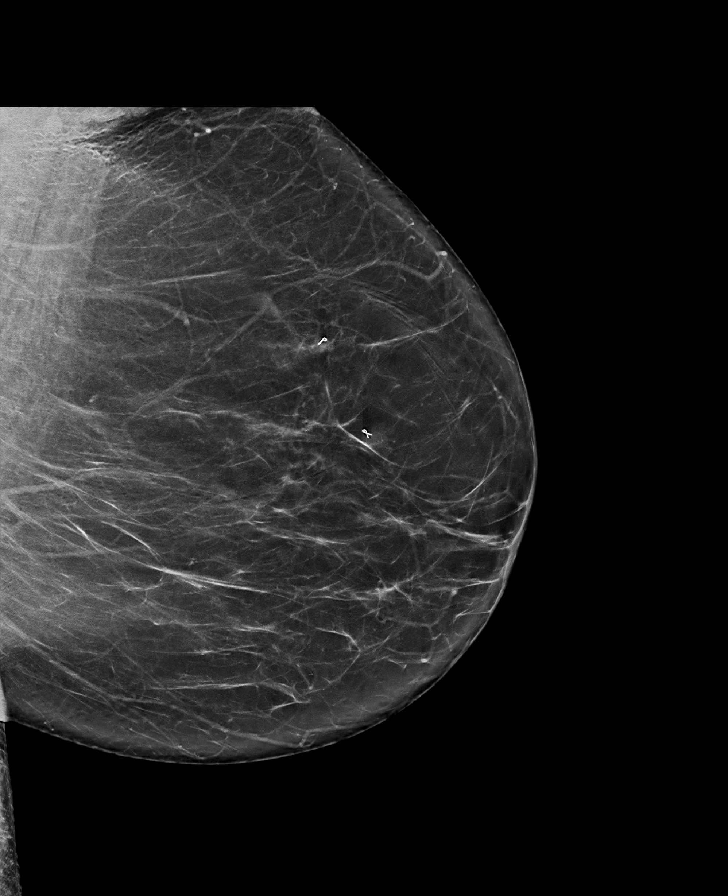

[R MLO synth-2D]
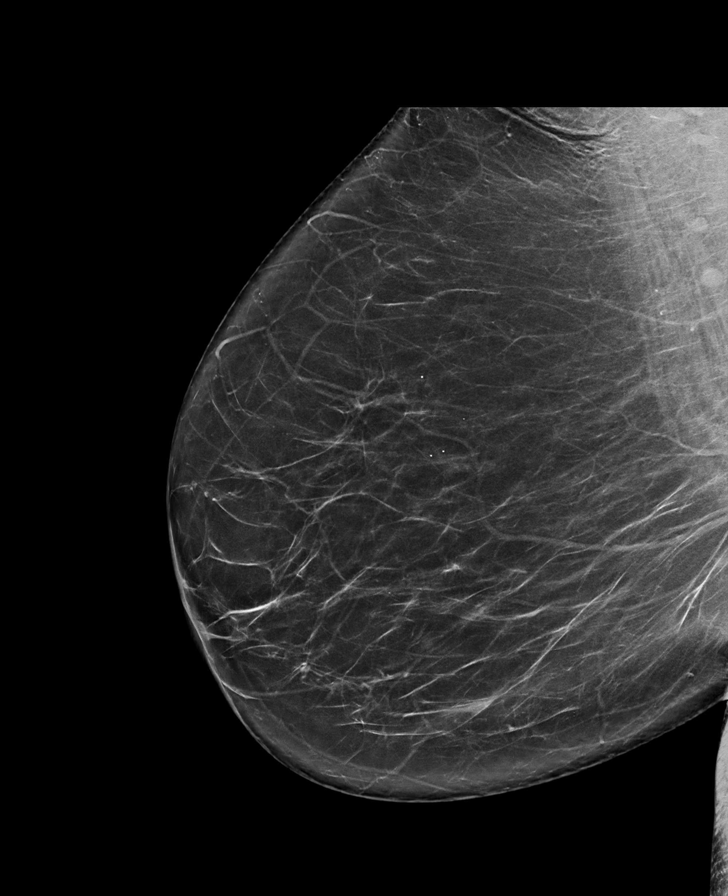

[L CC synth-2D]
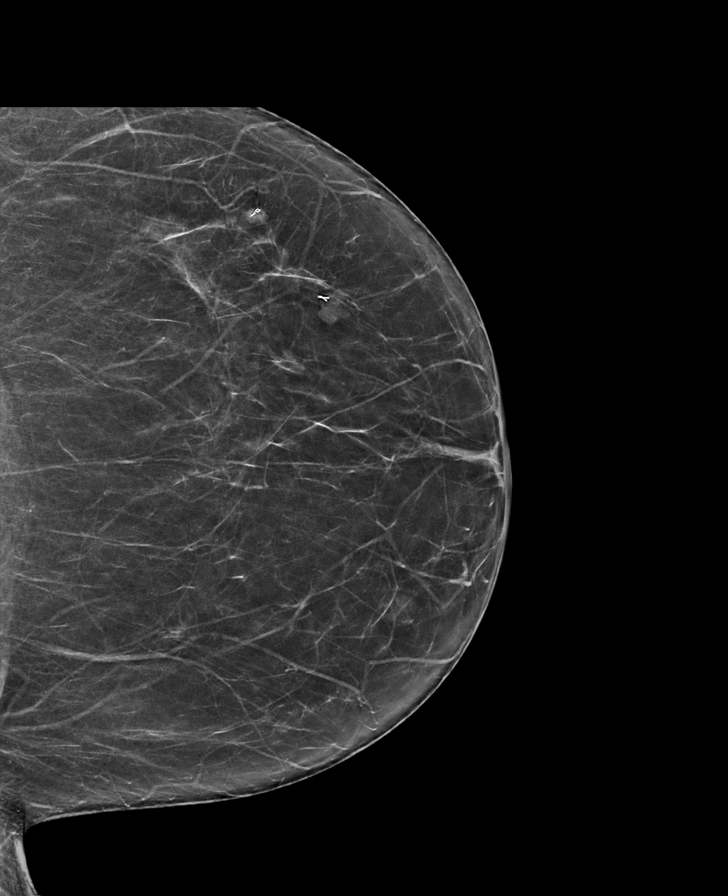

[R MLO tomo · tomo slice 43/84.0]
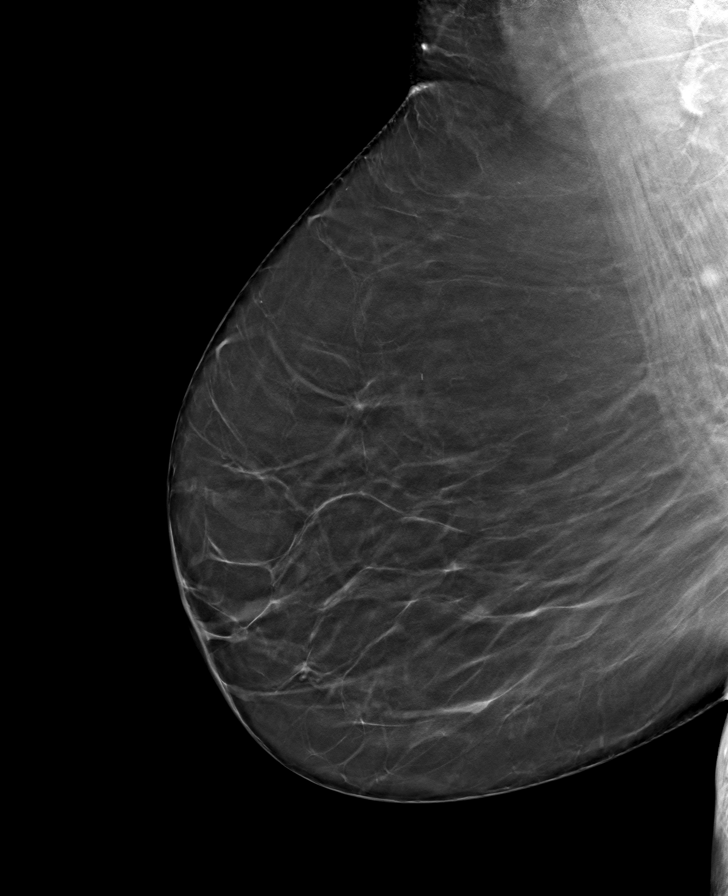

[L MLO tomo · tomo slice 41/82.0]
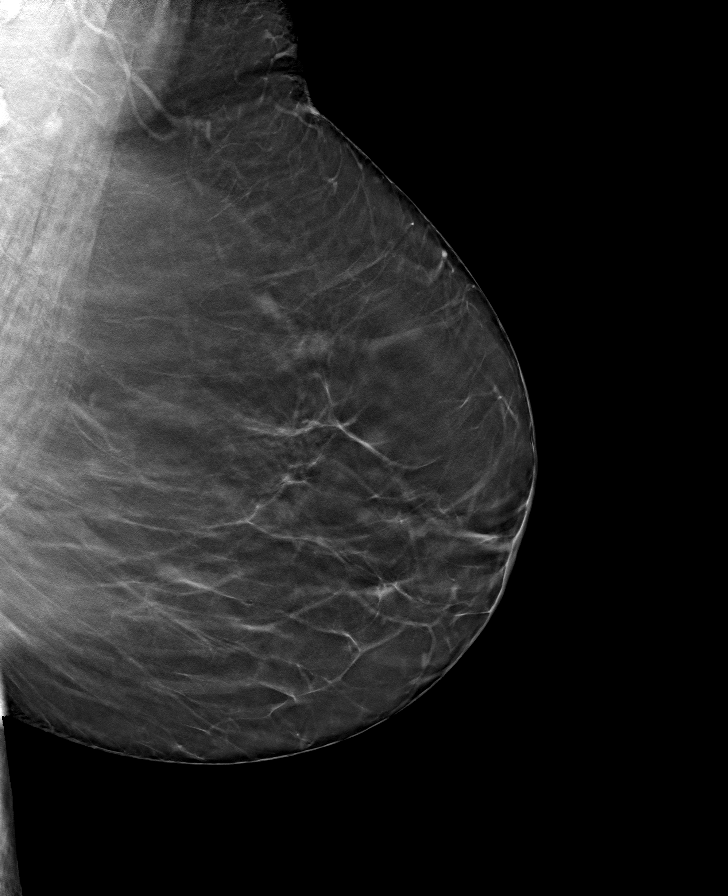

[L CC tomo · tomo slice 33/65.0]
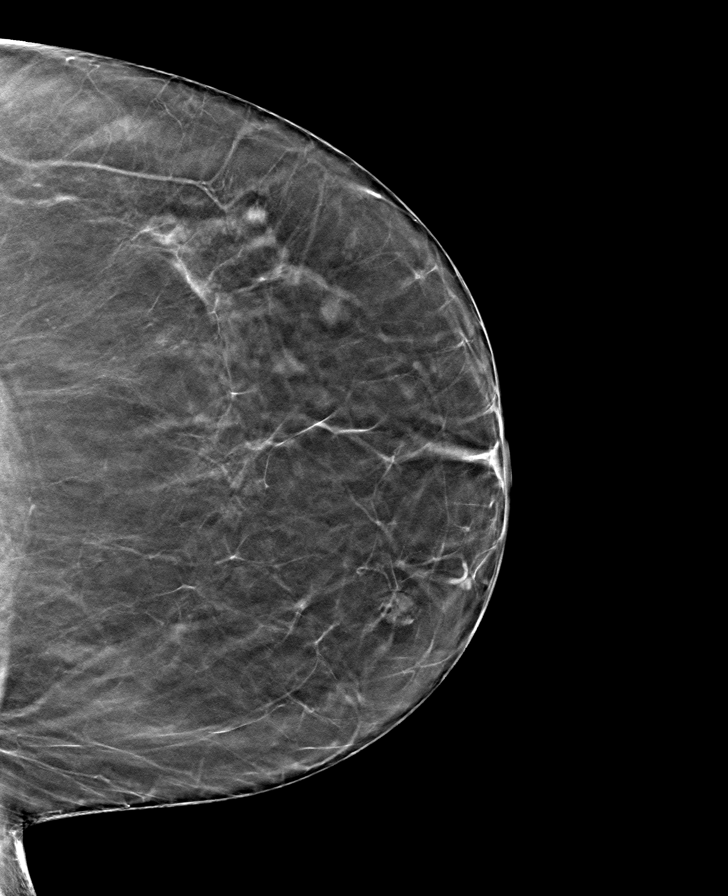

[R CC tomo · tomo slice 36/71.0]
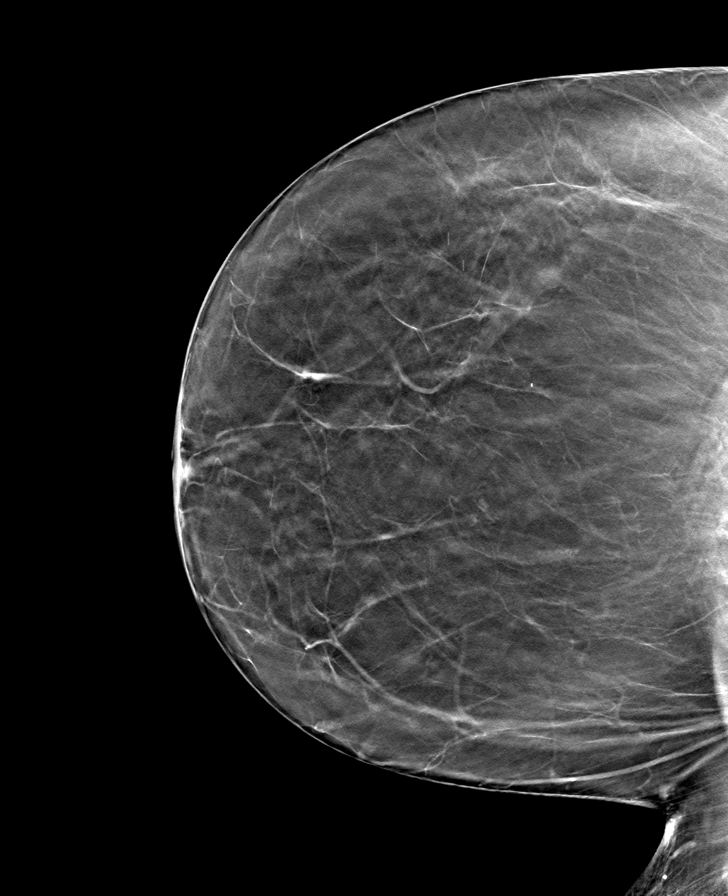

[8 of 24 positions shown; findings below may reference images not displayed]

ACR Breast Density Category b: There are scattered areas of
fibroglandular density.
FINDINGS: In the left breast, a possible mass warrants further evaluation. In
the right breast, no findings suspicious for malignancy.
IMPRESSION: Further evaluation is suggested for a possible mass in the left
breast.

RECOMMENDATION:
Diagnostic mammogram and possibly ultrasound of the left breast.
(Code:[id number]-X-44M)

The patient will be contacted regarding the findings, and additional
imaging will be scheduled.

BI-RADS CATEGORY  0: Incomplete. Need additional imaging evaluation
and/or prior mammograms for comparison.

## 2023-08-08 ENCOUNTER — Other Ambulatory Visit (HOSPITAL_COMMUNITY): Payer: Self-pay

## 2023-08-08 ENCOUNTER — Other Ambulatory Visit (HOSPITAL_BASED_OUTPATIENT_CLINIC_OR_DEPARTMENT_OTHER): Payer: Self-pay

## 2023-08-08 DIAGNOSIS — R7303 Prediabetes: Secondary | ICD-10-CM | POA: Diagnosis not present

## 2023-08-08 DIAGNOSIS — E7800 Pure hypercholesterolemia, unspecified: Secondary | ICD-10-CM | POA: Diagnosis not present

## 2023-08-08 DIAGNOSIS — I119 Hypertensive heart disease without heart failure: Secondary | ICD-10-CM | POA: Diagnosis not present

## 2023-08-08 DIAGNOSIS — R635 Abnormal weight gain: Secondary | ICD-10-CM | POA: Diagnosis not present

## 2023-08-08 MED ORDER — ZEPBOUND 2.5 MG/0.5ML ~~LOC~~ SOAJ
2.5000 mg | SUBCUTANEOUS | 0 refills | Status: AC
  Filled 2023-08-08 – 2023-11-25 (×5): qty 2, 28d supply, fill #0

## 2023-08-16 ENCOUNTER — Other Ambulatory Visit: Payer: Self-pay

## 2023-08-21 ENCOUNTER — Ambulatory Visit: Payer: Self-pay | Admitting: Family Medicine

## 2023-09-26 ENCOUNTER — Other Ambulatory Visit (HOSPITAL_COMMUNITY): Payer: Self-pay

## 2023-09-26 MED ORDER — AMPHETAMINE-DEXTROAMPHETAMINE 15 MG PO TABS
15.0000 mg | ORAL_TABLET | Freq: Every day | ORAL | 0 refills | Status: DC
  Filled 2023-09-26: qty 30, 30d supply, fill #0

## 2023-09-26 MED ORDER — ALPRAZOLAM 0.25 MG PO TABS
0.2500 mg | ORAL_TABLET | Freq: Every day | ORAL | 0 refills | Status: DC
  Filled 2023-09-26: qty 30, 30d supply, fill #0

## 2023-09-30 ENCOUNTER — Other Ambulatory Visit (HOSPITAL_COMMUNITY): Payer: Self-pay

## 2023-11-18 ENCOUNTER — Other Ambulatory Visit (HOSPITAL_COMMUNITY): Payer: Self-pay

## 2023-11-18 MED ORDER — AMPHETAMINE-DEXTROAMPHETAMINE 15 MG PO TABS
15.0000 mg | ORAL_TABLET | ORAL | 0 refills | Status: DC
  Filled 2023-11-18: qty 30, 30d supply, fill #0

## 2023-11-25 ENCOUNTER — Other Ambulatory Visit (HOSPITAL_COMMUNITY): Payer: Self-pay

## 2023-11-25 ENCOUNTER — Encounter (HOSPITAL_COMMUNITY): Payer: Self-pay

## 2023-11-26 ENCOUNTER — Other Ambulatory Visit (HOSPITAL_COMMUNITY): Payer: Self-pay

## 2023-11-26 MED ORDER — ALPRAZOLAM 0.25 MG PO TABS
0.2500 mg | ORAL_TABLET | Freq: Every day | ORAL | 0 refills | Status: DC
  Filled 2023-11-26: qty 30, 30d supply, fill #0

## 2023-11-28 ENCOUNTER — Other Ambulatory Visit (HOSPITAL_COMMUNITY): Payer: Self-pay

## 2023-11-28 ENCOUNTER — Ambulatory Visit
Admission: EM | Admit: 2023-11-28 | Discharge: 2023-11-28 | Disposition: A | Discharge status: Home / Self Care | Payer: 59 | Attending: Internal Medicine | Admitting: Internal Medicine

## 2023-11-28 DIAGNOSIS — R3915 Urgency of urination: Secondary | ICD-10-CM | POA: Diagnosis not present

## 2023-11-28 DIAGNOSIS — R350 Frequency of micturition: Secondary | ICD-10-CM | POA: Diagnosis not present

## 2023-11-28 DIAGNOSIS — Z20822 Contact with and (suspected) exposure to covid-19: Secondary | ICD-10-CM | POA: Diagnosis not present

## 2023-11-28 DIAGNOSIS — B9789 Other viral agents as the cause of diseases classified elsewhere: Secondary | ICD-10-CM | POA: Diagnosis not present

## 2023-11-28 DIAGNOSIS — R32 Unspecified urinary incontinence: Secondary | ICD-10-CM | POA: Diagnosis not present

## 2023-11-28 DIAGNOSIS — J069 Acute upper respiratory infection, unspecified: Secondary | ICD-10-CM | POA: Insufficient documentation

## 2023-11-28 LAB — POCT URINALYSIS DIP (MANUAL ENTRY)
Bilirubin, UA: NEGATIVE
Blood, UA: NEGATIVE
Glucose, UA: NEGATIVE mg/dL
Ketones, POC UA: NEGATIVE mg/dL
Leukocytes, UA: NEGATIVE
Nitrite, UA: NEGATIVE
Protein Ur, POC: NEGATIVE mg/dL
Spec Grav, UA: 1.025
Urobilinogen, UA: 0.2 U/dL
pH, UA: 7

## 2023-11-28 MED ORDER — PROMETHAZINE-DM 6.25-15 MG/5ML PO SYRP
5.0000 mL | ORAL_SOLUTION | Freq: Every evening | ORAL | 0 refills | Status: AC | PRN
  Filled 2023-11-28: qty 118, 23d supply, fill #0

## 2023-11-28 MED ORDER — GUAIFENESIN ER 600 MG PO TB12
1200.0000 mg | ORAL_TABLET | Freq: Two times a day (BID) | ORAL | 0 refills | Status: AC
  Filled 2023-11-28: qty 20, 5d supply, fill #0

## 2023-11-28 NOTE — Discharge Instructions (Addendum)
 You have a viral illness which will improve on its own with rest, fluids, and medications to help with your symptoms.  Tylenol , guaifenesin  (plain mucinex ), and saline nasal sprays may help relieve symptoms.   Two teaspoons of honey in 1 cup of warm water every 4-6 hours may help with throat pains.  Humidifier in room at nighttime may help soothe cough (clean well daily).   For chest pain, shortness of breath, inability to keep food or fluids down without vomiting, fever that does not respond to tylenol  or motrin , or any other severe symptoms, please go to the ER for further evaluation. Return to urgent care as needed, otherwise follow-up with PCP.    Urine is negative for UTI.  Call your primary care provider to set up an appointment to discuss urinary incontinence further.

## 2023-11-28 NOTE — ED Triage Notes (Signed)
 Pt presents with complaints of dry, productive cough x 2 days (with clear sputum) and urinary incontinence x 1 week. Pt states I do not believe the two are related. The incontinence started before the cough and it is not only when I cough. If I need to pee it is like I cannot hold it, I have to go right then. Pt currently denies pain and other urinary symptoms at this time. Only medication taken is Tylenol .

## 2023-11-28 NOTE — ED Provider Notes (Signed)
 [facility]    CSN: [id number] Arrival date & time: 11/28/23  1153      History   Chief Complaint Chief Complaint  Patient presents with   Cough   Urinary Incontinence    HPI Deanna Long is a 53 y.o. female.   Deanna Long is a 53 y.o. female presenting for chief complaint of urinary urgency and frequency that started 7 days ago and cough/nasal congestion that started 2 days ago. Urinary symptoms have worsened in the last 2 days since developing cough and she reports intermittent bladder leaking when coughing. Gravida 3, Para 3. Denies history of urge/stress incontinence and frequent UTIs. No dysuria, nausea, vomiting, abdominal pain, diarrhea, gross hematuria, fever, chills, or dizziness. She reports intermittent right lower back discomfort but isn't sure if this is due to cough or urinary symptoms. She is not a diabetic and denies taking SGLT-2 inhibitor. Drinks mostly water, although admits to poor water intake. Does not drink urinary irritants. She has not taken AZO/other medicines to help with urinary symptoms.  Cough is productive with white/yellow sputum. Denies shortness of breath, chest pain, palpitations, leg swelling, and orthopnea associated with cough. Never smoker, denies drug use. Denies history of chronic respiratory problems. 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  She has not attempted use of any OTC medications to help with cough/cold symptoms PTA.    Cough   Past Medical History:  Diagnosis Date   Anemia    during pregnancy   Arthritis    Esophagitis    GERD (gastroesophageal reflux disease)    Headache    Heart murmur    Hypertension    Obesity    Sleep apnea    Superficial fungus infection of skin 08/23/2015    Patient Active Problem List   Diagnosis Date Noted   Trochanteric bursitis, right hip 08/03/2022   Dizziness 12/12/2019   Noncompliance with medication regimen 12/12/2019   Poorly-controlled hypertension  12/12/2019   GAD (generalized anxiety disorder) 02/17/2018   Right acetabular fracture (HCC) 03/06/2017   Leg pain 03/02/2017   Essential hypertension 10/16/2016   Vitamin D  deficiency 10/01/2016   Superficial fungus infection of skin 08/23/2015   GERD (gastroesophageal reflux disease) 02/20/2013   Muscle ache 02/20/2013   Morbid obesity (HCC) 02/20/2013    Past Surgical History:  Procedure Laterality Date   BREAST BIOPSY Left 12/24/2012   2 areas   CESAREAN SECTION     CHOLECYSTECTOMY N/A 04/08/2023   Procedure: LAPAROSCOPIC CHOLECYSTECTOMY WITH INDOCYANINE GREEN  CHOLANGIOGRAPHY;  Surgeon: Teresa Lonni HERO, MD;  Location: [facility];  Service: General;  Laterality: N/A;   HERNIA REPAIR     TUBAL LIGATION      OB History     Gravida  3   Para  3   Term      Preterm      AB      Living  3      SAB      IAB      Ectopic      Multiple      Live Births  3            Home Medications    Prior to Admission medications   Medication Sig Start Date End Date Taking? Authorizing Provider  Guaifenesin  1200 MG TB12 Take 1 tablet (1,200 mg total) by mouth in the morning and at bedtime. 11/28/23  Yes Enedelia Dorna HERO, FNP  promethazine -dextromethorphan (PROMETHAZINE -DM) 6.25-15 MG/5ML syrup  Take 5 mLs by mouth at bedtime as needed for cough. 11/28/23  Yes Enedelia Dorna HERO, FNP  acetaminophen  (TYLENOL ) 650 MG CR tablet Take 650-1,300 mg by mouth every 8 (eight) hours as needed for pain.    [provider]  ALPRAZolam  (XANAX ) 0.25 MG tablet Take 0.25 mg by mouth daily as needed for sleep or anxiety. 08/08/22   [provider]  ALPRAZolam  (XANAX ) 0.25 MG tablet Take 1 tablet (0.25 mg total) by mouth at bedtime. 11/25/23     amphetamine -dextroamphetamine  (ADDERALL) 15 MG tablet Take 1 tablet by mouth every morning before breakfast. 11/18/23     atorvastatin  (LIPITOR) 20 MG tablet Take 1 tablet (20 mg total) by mouth daily. 02/17/18 04/03/25  Lavell Lye A, FNP  estradiol  (ESTRACE ) 1 MG tablet Take 1 mg by mouth in the morning. 07/05/22   [provider]  lisinopril -hydrochlorothiazide  (ZESTORETIC ) 10-12.5 MG tablet Take 2 tablets by mouth in the morning. 01/10/23   [provider]  meloxicam (MOBIC) 15 MG tablet Take 15 mg by mouth daily. 08/29/22   [provider]  pantoprazole (PROTONIX) 20 MG tablet Take 20-40 mg by mouth daily before breakfast.    [provider]  progesterone  (PROMETRIUM ) 100 MG capsule Take 100 mg by mouth in the morning. 07/05/22   [provider]  spironolactone (ALDACTONE) 25 MG tablet Take 25 mg by mouth daily as needed (fluid retention). 01/10/23   [provider]  sucralfate (CARAFATE) 1 g tablet Take 1 g by mouth 2 (two) times daily.    [provider]  tirzepatide  (ZEPBOUND ) 2.5 MG/0.5ML Pen Inject 2.5 mg into the skin once a week. 08/08/23       Family History Family History  Problem Relation Age of Onset   Breast cancer Mother    Hypertension Mother    Cancer Mother        breast,kidney   Fibromyalgia Mother    Migraines Mother    Arthritis Father     Social History Social History   Tobacco Use   Smoking status: Never   Smokeless tobacco: Never  Vaping Use   Vaping status: Never Used  Substance Use Topics   Alcohol use: No   Drug use: No     Allergies   Patient has no known allergies.   Review of Systems Review of Systems  Respiratory:  Positive for cough.   Per HPI   Physical Exam Triage Vital Signs ED Triage Vitals  Encounter Vitals Group     BP 11/28/23 1208 124/83     Systolic BP Percentile --      Diastolic BP Percentile --      Pulse Rate 11/28/23 1208 71     Resp 11/28/23 1208 18     Temp 11/28/23 1208 97.6 F (36.4 C)     Temp Source 11/28/23 1208 Oral     SpO2 11/28/23 1208 97 %     Weight 11/28/23 1207 207 lb (93.9 kg)     Height 11/28/23 1207 5' 2 (1.575 m)     Head Circumference --      Peak  Flow --      Pain Score 11/28/23 1206 0     Pain Loc --      Pain Education --      Exclude from Growth Chart --    No data found.  Updated Vital Signs BP 124/83 (BP Location: Right Arm)   Pulse 71   Temp 97.6 F (36.4  C) (Oral)   Resp 18   Ht 5' 2 (1.575 m)   Wt 207 lb (93.9 kg)   LMP 12/20/2020   SpO2 97%   BMI 37.86 kg/m   Visual Acuity Right Eye Distance:   Left Eye Distance:   Bilateral Distance:    Right Eye Near:   Left Eye Near:    Bilateral Near:     Physical Exam Vitals and nursing note reviewed.  Constitutional:      Appearance: She is not ill-appearing or toxic-appearing.  HENT:     Head: Normocephalic and atraumatic.     Right Ear: Hearing, tympanic membrane, ear canal and external ear normal.     Left Ear: Hearing, tympanic membrane, ear canal and external ear normal.     Nose: Congestion present.     Mouth/Throat:     Lips: Pink.     Mouth: Mucous membranes are moist. No injury or oral lesions.     Dentition: Normal dentition.     Tongue: No lesions.     Pharynx: Oropharynx is clear. Uvula midline. Posterior oropharyngeal erythema present. No pharyngeal swelling, oropharyngeal exudate, uvula swelling or postnasal drip.     Tonsils: No tonsillar exudate.     Comments: Mild erythema to posterior oropharynx with small amount of clear postnasal drainage visualized. Eyes:     General: Lids are normal. Vision grossly intact. Gaze aligned appropriately.     Extraocular Movements: Extraocular movements intact.     Conjunctiva/sclera: Conjunctivae normal.  Neck:     Trachea: Trachea and phonation normal.  Cardiovascular:     Rate and Rhythm: Normal rate and regular rhythm.     Heart sounds: Normal heart sounds, S1 normal and S2 normal.  Pulmonary:     Effort: Pulmonary effort is normal. No respiratory distress.     Breath sounds: Normal breath sounds and air entry.  Abdominal:     General: Bowel sounds are normal.     Palpations: Abdomen is soft.      Tenderness: There is no abdominal tenderness. There is no right CVA tenderness, left CVA tenderness or guarding.  Musculoskeletal:     Cervical back: Neck supple.  Lymphadenopathy:     Cervical: No cervical adenopathy.  Skin:    General: Skin is warm and dry.     Capillary Refill: Capillary refill takes less than 2 seconds.     Findings: No rash.  Neurological:     General: No focal deficit present.     Mental Status: She is alert and oriented to person, place, and time. Mental status is at baseline.     Cranial Nerves: No dysarthria or facial asymmetry.  Psychiatric:        Mood and Affect: Mood normal.        Speech: Speech normal.        Behavior: Behavior normal.        Thought Content: Thought content normal.        Judgment: Judgment normal.      UC Treatments / Results  Labs (all labs ordered are listed, but only abnormal results are displayed) Labs Reviewed  POCT URINALYSIS DIP (MANUAL ENTRY) - Normal  SARS CORONAVIRUS 2 (TAT 6-24 HRS)    EKG   Radiology No results found.  Procedures Procedures (including critical care time)  Medications Ordered in UC Medications - No data to display  Initial Impression / Assessment and Plan / UC Course  I have reviewed the triage vital signs and the  nursing notes.  Pertinent labs & imaging results that were available during my care of the patient were reviewed by me and considered in my medical decision making (see chart for details).   1. Viral URI with cough Suspect viral URI, viral syndrome.  Strep/viral testing:   Physical exam findings reassuring, vital signs hemodynamically stable, and lungs clear, therefore deferred imaging of the chest.  Advised supportive care/prescriptions for symptomatic relief as outlined in AVS.   2. Urinary urgency Suspect stress/urge incontinence.  Urinalysis unremarkable for signs of UTI. Advised to discuss this further with PCP as she may benefit from pelvic floor physical therapy  to strengthen bladder muscles. Low suspicion for spinal pathology/neurologic cause of incontinence/urgency.  Counseled patient on potential for adverse effects with medications prescribed/recommended today, strict ER and return-to-clinic precautions discussed, patient verbalized understanding.    Final Clinical Impressions(s) / UC Diagnoses   Final diagnoses:  Viral URI with cough  Urinary urgency     Discharge Instructions      You have a viral illness which will improve on its own with rest, fluids, and medications to help with your symptoms.  Tylenol , guaifenesin  (plain mucinex ), and saline nasal sprays may help relieve symptoms.   Two teaspoons of honey in 1 cup of warm water every 4-6 hours may help with throat pains.  Humidifier in room at nighttime may help soothe cough (clean well daily).   For chest pain, shortness of breath, inability to keep food or fluids down without vomiting, fever that does not respond to tylenol  or motrin , or any other severe symptoms, please go to the ER for further evaluation. Return to urgent care as needed, otherwise follow-up with PCP.    Urine is negative for UTI.  Call your primary care provider to set up an appointment to discuss urinary incontinence further.      ED Prescriptions     Medication Sig Dispense Auth. Provider   Guaifenesin  1200 MG TB12 Take 1 tablet (1,200 mg total) by mouth in the morning and at bedtime. 14 tablet Enedelia Dorna HERO, FNP   promethazine -dextromethorphan (PROMETHAZINE -DM) 6.25-15 MG/5ML syrup Take 5 mLs by mouth at bedtime as needed for cough. 118 mL Enedelia Dorna HERO, FNP      PDMP not reviewed this encounter.   Enedelia Dorna HERO, [place] 11/28/23 1257

## 2023-11-29 LAB — SARS CORONAVIRUS 2 (TAT 6-24 HRS): SARS Coronavirus 2: NEGATIVE

## 2023-12-10 ENCOUNTER — Other Ambulatory Visit: Payer: Self-pay

## 2023-12-10 ENCOUNTER — Other Ambulatory Visit (HOSPITAL_COMMUNITY): Payer: Self-pay

## 2023-12-10 DIAGNOSIS — R609 Edema, unspecified: Secondary | ICD-10-CM | POA: Diagnosis not present

## 2023-12-10 DIAGNOSIS — I119 Hypertensive heart disease without heart failure: Secondary | ICD-10-CM | POA: Diagnosis not present

## 2023-12-10 DIAGNOSIS — Z6837 Body mass index (BMI) 37.0-37.9, adult: Secondary | ICD-10-CM | POA: Diagnosis not present

## 2023-12-10 MED ORDER — ESTRADIOL 1 MG PO TABS
1.0000 mg | ORAL_TABLET | Freq: Every day | ORAL | 2 refills | Status: AC
  Filled 2023-12-10 – 2024-04-12 (×4): qty 90, 90d supply, fill #0

## 2023-12-10 MED ORDER — LISINOPRIL-HYDROCHLOROTHIAZIDE 20-12.5 MG PO TABS
2.0000 | ORAL_TABLET | Freq: Every day | ORAL | 2 refills | Status: AC
  Filled 2023-12-10: qty 180, 90d supply, fill #0

## 2023-12-10 MED ORDER — PROGESTERONE MICRONIZED 100 MG PO CAPS
100.0000 mg | ORAL_CAPSULE | Freq: Every day | ORAL | 1 refills | Status: AC
  Filled 2023-12-10 – 2024-04-12 (×4): qty 90, 90d supply, fill #0

## 2023-12-12 ENCOUNTER — Encounter (INDEPENDENT_AMBULATORY_CARE_PROVIDER_SITE_OTHER): Payer: Self-pay

## 2023-12-16 ENCOUNTER — Other Ambulatory Visit (HOSPITAL_COMMUNITY): Payer: Self-pay

## 2023-12-16 MED ORDER — LISINOPRIL 40 MG PO TABS
40.0000 mg | ORAL_TABLET | Freq: Every day | ORAL | 1 refills | Status: AC
  Filled 2023-12-16 – 2024-04-12 (×4): qty 90, 90d supply, fill #0

## 2023-12-16 MED ORDER — AMLODIPINE BESYLATE 5 MG PO TABS
5.0000 mg | ORAL_TABLET | Freq: Every day | ORAL | 1 refills | Status: AC
  Filled 2023-12-16 – 2024-04-12 (×4): qty 90, 90d supply, fill #0

## 2023-12-20 ENCOUNTER — Other Ambulatory Visit (HOSPITAL_COMMUNITY): Payer: Self-pay

## 2023-12-26 ENCOUNTER — Other Ambulatory Visit (HOSPITAL_COMMUNITY): Payer: Self-pay

## 2023-12-31 ENCOUNTER — Other Ambulatory Visit (HOSPITAL_COMMUNITY): Payer: Self-pay

## 2023-12-31 ENCOUNTER — Other Ambulatory Visit: Payer: Self-pay

## 2024-01-10 ENCOUNTER — Other Ambulatory Visit (HOSPITAL_COMMUNITY): Payer: Self-pay

## 2024-01-16 ENCOUNTER — Other Ambulatory Visit (HOSPITAL_COMMUNITY): Payer: Self-pay

## 2024-01-16 MED ORDER — PROGESTERONE MICRONIZED 100 MG PO CAPS
100.0000 mg | ORAL_CAPSULE | Freq: Every day | ORAL | 1 refills | Status: AC
  Filled 2024-01-16: qty 90, 90d supply, fill #0
  Filled 2024-03-13: qty 90, 90d supply, fill #1

## 2024-01-16 MED ORDER — ESTRADIOL 1 MG PO TABS
1.0000 mg | ORAL_TABLET | Freq: Every day | ORAL | 2 refills | Status: AC
  Filled 2024-01-16: qty 90, 90d supply, fill #0

## 2024-01-16 MED ORDER — LISINOPRIL 40 MG PO TABS
40.0000 mg | ORAL_TABLET | Freq: Every day | ORAL | 1 refills | Status: AC
  Filled 2024-01-16: qty 90, 90d supply, fill #0

## 2024-01-16 MED ORDER — AMLODIPINE BESYLATE 5 MG PO TABS
5.0000 mg | ORAL_TABLET | Freq: Every day | ORAL | 1 refills | Status: AC
  Filled 2024-01-16: qty 90, 90d supply, fill #0

## 2024-01-17 ENCOUNTER — Other Ambulatory Visit (HOSPITAL_COMMUNITY): Payer: Self-pay

## 2024-01-17 DIAGNOSIS — R8290 Unspecified abnormal findings in urine: Secondary | ICD-10-CM | POA: Diagnosis not present

## 2024-01-17 DIAGNOSIS — B9689 Other specified bacterial agents as the cause of diseases classified elsewhere: Secondary | ICD-10-CM | POA: Diagnosis not present

## 2024-01-17 DIAGNOSIS — Z113 Encounter for screening for infections with a predominantly sexual mode of transmission: Secondary | ICD-10-CM | POA: Diagnosis not present

## 2024-01-17 DIAGNOSIS — N760 Acute vaginitis: Secondary | ICD-10-CM | POA: Diagnosis not present

## 2024-01-17 MED ORDER — ALPRAZOLAM 0.25 MG PO TABS
0.2500 mg | ORAL_TABLET | Freq: Every day | ORAL | 0 refills | Status: DC
  Filled 2024-01-17: qty 30, 30d supply, fill #0

## 2024-01-17 MED ORDER — AMPHETAMINE-DEXTROAMPHETAMINE 15 MG PO TABS
15.0000 mg | ORAL_TABLET | Freq: Every day | ORAL | 0 refills | Status: AC
  Filled 2024-01-17: qty 30, 30d supply, fill #0

## 2024-01-17 MED ORDER — METRONIDAZOLE 500 MG PO TABS
500.0000 mg | ORAL_TABLET | Freq: Two times a day (BID) | ORAL | 0 refills | Status: AC
  Filled 2024-01-17: qty 14, 7d supply, fill #0

## 2024-02-17 ENCOUNTER — Other Ambulatory Visit (HOSPITAL_COMMUNITY): Payer: Self-pay

## 2024-03-13 ENCOUNTER — Other Ambulatory Visit (HOSPITAL_COMMUNITY): Payer: Self-pay

## 2024-03-13 MED ORDER — AMPHETAMINE-DEXTROAMPHETAMINE 15 MG PO TABS
15.0000 mg | ORAL_TABLET | Freq: Every day | ORAL | 0 refills | Status: AC
Start: 2024-03-13 — End: ?
  Filled 2024-03-13: qty 30, 30d supply, fill #0

## 2024-03-13 MED ORDER — ALPRAZOLAM 0.25 MG PO TABS
0.2500 mg | ORAL_TABLET | Freq: Every day | ORAL | 0 refills | Status: AC
Start: 2024-03-13 — End: ?
  Filled 2024-03-13: qty 30, 30d supply, fill #0

## 2024-03-17 ENCOUNTER — Encounter (HOSPITAL_COMMUNITY): Payer: Self-pay

## 2024-03-17 ENCOUNTER — Other Ambulatory Visit (HOSPITAL_COMMUNITY): Payer: Self-pay

## 2024-04-13 ENCOUNTER — Other Ambulatory Visit (HOSPITAL_COMMUNITY): Payer: Self-pay

## 2024-04-14 ENCOUNTER — Other Ambulatory Visit: Payer: Self-pay

## 2024-04-14 ENCOUNTER — Other Ambulatory Visit (HOSPITAL_COMMUNITY): Payer: Self-pay

## 2024-04-24 ENCOUNTER — Other Ambulatory Visit (HOSPITAL_COMMUNITY): Payer: Self-pay

## 2024-05-21 ENCOUNTER — Other Ambulatory Visit (HOSPITAL_COMMUNITY)

## 2024-09-21 ENCOUNTER — Other Ambulatory Visit: Payer: Self-pay | Admitting: Medical Genetics

## 2024-09-21 DIAGNOSIS — Z006 Encounter for examination for normal comparison and control in clinical research program: Secondary | ICD-10-CM
